# Patient Record
Sex: Female | Born: 1955 | Race: Black or African American | Hispanic: No | Smoking: Current every day smoker
Health system: Southern US, Community
[De-identification: ages and names within clinical notes are randomized; demographics above are authoritative.]

## PROBLEM LIST (undated history)

## (undated) DIAGNOSIS — K5792 Diverticulitis of intestine, part unspecified, without perforation or abscess without bleeding: Secondary | ICD-10-CM

## (undated) DIAGNOSIS — K635 Polyp of colon: Secondary | ICD-10-CM

## (undated) DIAGNOSIS — E039 Hypothyroidism, unspecified: Secondary | ICD-10-CM

## (undated) DIAGNOSIS — E7800 Pure hypercholesterolemia, unspecified: Secondary | ICD-10-CM

## (undated) DIAGNOSIS — I10 Essential (primary) hypertension: Secondary | ICD-10-CM

## (undated) HISTORY — PX: COLPOSCOPY: SHX161

## (undated) HISTORY — PX: BREAST BIOPSY: SHX20

## (undated) HISTORY — PX: TUBAL LIGATION: SHX77

## (undated) HISTORY — DX: Diverticulitis of intestine, part unspecified, without perforation or abscess without bleeding: K57.92

## (undated) HISTORY — DX: Pure hypercholesterolemia, unspecified: E78.00

## (undated) HISTORY — PX: FOOT SURGERY: SHX648

## (undated) HISTORY — DX: Essential (primary) hypertension: I10

## (undated) HISTORY — DX: Polyp of colon: K63.5

## (undated) HISTORY — PX: CHOLECYSTECTOMY: SHX55

---

## 2010-04-22 ENCOUNTER — Encounter: Admission: RE | Admit: 2010-04-22 | Discharge: 2010-04-22 | Payer: Self-pay | Admitting: Specialist

## 2017-12-28 ENCOUNTER — Other Ambulatory Visit: Payer: Self-pay | Admitting: Obstetrics and Gynecology

## 2017-12-28 DIAGNOSIS — R928 Other abnormal and inconclusive findings on diagnostic imaging of breast: Secondary | ICD-10-CM

## 2018-01-04 ENCOUNTER — Other Ambulatory Visit: Payer: Self-pay | Admitting: Obstetrics and Gynecology

## 2018-01-04 ENCOUNTER — Encounter: Payer: Self-pay | Admitting: Radiology

## 2018-01-04 ENCOUNTER — Ambulatory Visit
Admission: RE | Admit: 2018-01-04 | Discharge: 2018-01-04 | Disposition: A | Discharge status: Home / Self Care | Payer: BLUE CROSS/BLUE SHIELD | Source: Ambulatory Visit | Attending: Obstetrics and Gynecology | Admitting: Obstetrics and Gynecology

## 2018-01-04 DIAGNOSIS — R921 Mammographic calcification found on diagnostic imaging of breast: Secondary | ICD-10-CM

## 2018-01-04 DIAGNOSIS — R928 Other abnormal and inconclusive findings on diagnostic imaging of breast: Secondary | ICD-10-CM

## 2018-01-08 ENCOUNTER — Ambulatory Visit
Admission: RE | Admit: 2018-01-08 | Discharge: 2018-01-08 | Disposition: A | Discharge status: Home / Self Care | Payer: BLUE CROSS/BLUE SHIELD | Source: Ambulatory Visit | Attending: Obstetrics and Gynecology | Admitting: Obstetrics and Gynecology

## 2018-01-08 ENCOUNTER — Other Ambulatory Visit: Payer: Self-pay | Admitting: Obstetrics and Gynecology

## 2018-01-08 DIAGNOSIS — R921 Mammographic calcification found on diagnostic imaging of breast: Secondary | ICD-10-CM

## 2018-05-28 ENCOUNTER — Other Ambulatory Visit: Payer: Self-pay | Admitting: Obstetrics and Gynecology

## 2018-05-28 DIAGNOSIS — Z1231 Encounter for screening mammogram for malignant neoplasm of breast: Secondary | ICD-10-CM

## 2018-07-09 ENCOUNTER — Other Ambulatory Visit: Payer: Self-pay | Admitting: Obstetrics and Gynecology

## 2018-07-09 ENCOUNTER — Ambulatory Visit
Admission: RE | Admit: 2018-07-09 | Discharge: 2018-07-09 | Disposition: A | Discharge status: Home / Self Care | Payer: BLUE CROSS/BLUE SHIELD | Source: Ambulatory Visit | Attending: Obstetrics and Gynecology | Admitting: Obstetrics and Gynecology

## 2018-07-09 DIAGNOSIS — R921 Mammographic calcification found on diagnostic imaging of breast: Secondary | ICD-10-CM

## 2018-07-09 DIAGNOSIS — Z1231 Encounter for screening mammogram for malignant neoplasm of breast: Secondary | ICD-10-CM

## 2018-08-11 ENCOUNTER — Ambulatory Visit
Admission: RE | Admit: 2018-08-11 | Discharge: 2018-08-11 | Disposition: A | Discharge status: Home / Self Care | Payer: BLUE CROSS/BLUE SHIELD | Source: Ambulatory Visit | Attending: Obstetrics and Gynecology | Admitting: Obstetrics and Gynecology

## 2018-08-11 ENCOUNTER — Other Ambulatory Visit: Payer: Self-pay | Admitting: Obstetrics and Gynecology

## 2018-08-11 DIAGNOSIS — R921 Mammographic calcification found on diagnostic imaging of breast: Secondary | ICD-10-CM

## 2019-02-09 ENCOUNTER — Other Ambulatory Visit: Payer: Self-pay | Admitting: Registered Nurse

## 2019-02-09 DIAGNOSIS — R921 Mammographic calcification found on diagnostic imaging of breast: Secondary | ICD-10-CM

## 2019-02-10 ENCOUNTER — Other Ambulatory Visit: Payer: Self-pay | Admitting: Registered Nurse

## 2019-02-10 ENCOUNTER — Other Ambulatory Visit: Payer: Self-pay

## 2019-02-10 ENCOUNTER — Ambulatory Visit
Admission: RE | Admit: 2019-02-10 | Discharge: 2019-02-10 | Disposition: A | Discharge status: Home / Self Care | Payer: BC Managed Care – PPO | Source: Ambulatory Visit | Attending: Obstetrics and Gynecology | Admitting: Obstetrics and Gynecology

## 2019-02-10 DIAGNOSIS — R921 Mammographic calcification found on diagnostic imaging of breast: Secondary | ICD-10-CM

## 2019-08-12 ENCOUNTER — Other Ambulatory Visit: Payer: Self-pay

## 2019-08-15 ENCOUNTER — Other Ambulatory Visit: Payer: Self-pay

## 2019-08-15 ENCOUNTER — Ambulatory Visit: Payer: BC Managed Care – PPO | Admitting: Obstetrics and Gynecology

## 2019-08-15 ENCOUNTER — Ambulatory Visit
Admission: RE | Admit: 2019-08-15 | Discharge: 2019-08-15 | Disposition: A | Discharge status: Home / Self Care | Payer: BC Managed Care – PPO | Source: Ambulatory Visit | Attending: Registered Nurse | Admitting: Registered Nurse

## 2019-08-15 DIAGNOSIS — R921 Mammographic calcification found on diagnostic imaging of breast: Secondary | ICD-10-CM

## 2019-08-16 ENCOUNTER — Encounter: Payer: Self-pay | Admitting: Gynecology

## 2019-08-16 NOTE — Progress Notes (Unsigned)
Patient not seen by provider.  Records were not obtained prior to the visit today so she ultimately rescheduled to another day.  No charge for this encounter.

## 2019-08-17 ENCOUNTER — Encounter: Payer: Self-pay | Admitting: Obstetrics and Gynecology

## 2019-08-17 ENCOUNTER — Other Ambulatory Visit: Payer: Self-pay

## 2019-08-17 ENCOUNTER — Ambulatory Visit: Payer: BC Managed Care – PPO | Admitting: Obstetrics and Gynecology

## 2019-08-17 DIAGNOSIS — N8502 Endometrial intraepithelial neoplasia [EIN]: Secondary | ICD-10-CM | POA: Insufficient documentation

## 2019-08-17 DIAGNOSIS — I10 Essential (primary) hypertension: Secondary | ICD-10-CM | POA: Insufficient documentation

## 2019-08-17 DIAGNOSIS — E039 Hypothyroidism, unspecified: Secondary | ICD-10-CM | POA: Insufficient documentation

## 2019-08-17 NOTE — Progress Notes (Signed)
Destiny Larsen  Aug 27, 1955 [id number]  HPI The patient is a 64 y.o.  G2P1011 who presents today for further evaluation and management of postmenopausal bleeding and endometrial hyperplasia.  She was seen by her OB/GYN provider in [place], [place], and was having postmenopausal bleeding.  She described two episodes back in November 2020 starting with pinkish discharge followed by significant amount of bright red bleeding after a bowel movement.  She has not had any bleeding since then and her provider gave her Provera which she has been taking.  We do have her records faxed from her evaluation.  She had a pelvic ultrasound on 07/26/2019 which indicated a uterus 4.9 x 3.9 x 5.1 cm and a structure within the cervix that appeared to be a polyp measuring 1.8 cm.  Her endometrium was noted to be thickened measuring 1.3 cm in maximum dimension.  Her bilateral ovaries were noted to be normal.  She had a Pap smear performed 07/07/2019 which was NIL.  HPV test was negative.  She had an endometrial biopsy which indicated "atypical glandular hyperplasia" on 07/27/2019.  There is mention that she had a polypectomy which was not completely removed but the portion that was evaluated was benign per the provider note.  We do not have the pathology report for that particular specimen.   She states her provider told her that hysterectomy would be the recommended treatment and she wanted to seek another opinion.  She had a prior tubal ligation.  Prior SVD x 1.  She is currently semiretired but has started working as a Scientist, water quality at [employer], sometimes Corporate investment banker duties.  She spent 30+ years working for the state of [place].   Past medical history,surgical history, problem list, medications, allergies, family history and social history were all reviewed and documented as reviewed in the EPIC chart. Family history is notable for breast cancer in her mother and a "uterine cancer" on the "outside of the uterus" in her  sister.  No known BRCA testing.  ROS:  Feeling well. No dyspnea or chest pain on exertion.  No abdominal pain, change in bowel habits, black or bloody stools.  No urinary tract symptoms. GYN ROS: no active abnormal bleeding, pelvic pain or discharge. No neurological complaints.  Physical Exam  Ht '5\' 5"'  (1.651 m)   Wt 191 lb (86.6 kg)   BMI 31.78 kg/m   General: Pleasant female, no acute distress, alert and oriented PELVIC EXAM: Deferred until next visit.  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  I discussed the various types of hyperplasia with the patient and the fact that the finding of complex hyperplasia with atypia on endometrial sampling has upwards of a nearly 40% rate of coexisting with an endometrial cancer.  The standard of care for treatment of this condition is hysterectomy in patients who are healthy enough to tolerate surgery.  She would prefer to have surgery here in [place].  Based off of her surgical history, our tentative plan would be to perform a total laparoscopic hysterectomy with bilateral salpingo-oophorectomy.  We will have her scheduled for a preoperative visit and perform a pelvic exam at that time.  I provided her with the ACOG pamphlet on hysterectomy and we will discuss her questions in further detail at our next meeting, including risks of the surgery.  We did briefly touch on postoperative recovery expectations and need for at least 6 weeks  off from employment duties.  I did caution her that given the current pandemic there is potential for limitation on performing nonemergent procedures at this time, so we may have to wait for a certain amount of time before being able to have this procedure done.  Patient acknowledges understanding and will plan to get in touch with her regarding the next steps.  All questions were  answered by the end of her visit.  40 minutes were spent in this patient encounter including time reviewing previous tests and documentation.  Larey Days MD 08/17/19

## 2019-08-17 NOTE — Patient Instructions (Signed)
It was nice to meet you today, thank you for coming to [facility]. I will reach out to my staff and they should be in contact with you regarding the next steps in scheduling a follow-up visit with me and the surgery. Please let me know if any questions or do not hear anything further within the next week or two.

## 2019-08-19 ENCOUNTER — Telehealth: Payer: Self-pay

## 2019-08-19 NOTE — Telephone Encounter (Signed)
Left message for patient to call me about scheduling.

## 2019-08-30 NOTE — Telephone Encounter (Signed)
Patient called back and we discussed that I had spoken with her ins company and was advised she has $40 copayment for surgery and then pays 100%.  I told her that twice I clarified with rep that we were talking about surgery at hospital and not in the office and she said that was correct.  I warned patient that this did not sound reasonable but perhaps it was but to be prepared that she might end up owing a bill to [facility].  She said she understood.  We scheduled her for 09/20/19 at 7:30am at [facility].  I scheduled her Covid screen accordingly and advised her regarding quarantine protocol.    Pre op appt was scheduled for her and packet was mailed to her.

## 2019-09-07 ENCOUNTER — Encounter: Payer: Self-pay | Admitting: Gynecology

## 2019-09-09 ENCOUNTER — Other Ambulatory Visit: Payer: Self-pay

## 2019-09-12 ENCOUNTER — Ambulatory Visit: Payer: BC Managed Care – PPO | Admitting: Obstetrics and Gynecology

## 2019-09-12 ENCOUNTER — Other Ambulatory Visit: Payer: Self-pay

## 2019-09-12 ENCOUNTER — Encounter: Payer: Self-pay | Admitting: Obstetrics and Gynecology

## 2019-09-12 VITALS — BP 122/78

## 2019-09-12 DIAGNOSIS — N8502 Endometrial intraepithelial neoplasia [EIN]: Secondary | ICD-10-CM | POA: Diagnosis not present

## 2019-09-12 NOTE — Progress Notes (Signed)
   Destiny Larsen  1956-05-10 [id number]  HPI The patient is a 64 y.o. G2P0011 who presents today for preoperative visit preparation for a hysterectomy on 09/20/2019 with the indication of complex endometrial hyperplasia with atypia/EIN.  See the previous note from 08/17/2019 for details.  She has had light ongoing vaginal bleeding.  She did have a chance to review the ACOG  hysterectomy informational pamphlet.  Past medical history,surgical history, problem list, medications, allergies, family history and social history were all reviewed and documented as reviewed in the EPIC chart.  ROS:  Feeling well. No dyspnea or chest pain on exertion.  No abdominal pain, change in bowel habits, black or bloody stools.  No urinary tract symptoms. GYN ROS: + abnormal bleeding, pelvic pain or discharge.  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  We will perform TLH with bilateral salpingo-oophorectomy and cystoscopy.  I discussed the hysterectomy procedure in detail with the patient including the length of time anticipated to perform the procedure, the expectation for overnight stay in the hospital and subsequent discharge the following day in the absence of complications, and postoperative convalescence and recovery expectations.  I discussed that she will need to plan to be off of work for potentially up to 6 weeks but may be able go  back sooner a limited capacity keeping in mind lifting restrictions of 20 lbs and no repetitive squatting, bending, or straining maneuvers.  Pelvic rest restriction was also reviewed.  The patient is aware that hysterectomy is considered to be a major surgical procedure. There are risks of infection, bleeding, possibility of needing a blood transfusion, injury to bowel, bladder, major pelvic vessels, ureter, nerves from positioning and or skin irritation, and deep venous thrombosis.  General anesthesia also has risks including myocardial infarction, stroke, and death.  Common scenarios for complications from surgery were reviewed with the patient including focus on bladder and/or ureteral injuries, bowel injuries and complications such as bowel obstruction and rarely the need for temporary or permanent ostomies, and infections that may be minor or more involved and require readmission for IV antibiotics and/or repeat surgery.  Generally the complication rate is less than 1%.  Conversion to laparotomy may be deemed necessary at the time of the procedure, which if performed would result in longer hospital stay, and more postoperative pain and healing time.     The patient has all questions answered and agrees to proceeding.  Joseph Pierini MD 09/12/19

## 2019-09-12 NOTE — Patient Instructions (Signed)
Total Laparoscopic Hysterectomy °A total laparoscopic hysterectomy is a minimally invasive surgery to remove the uterus and cervix. The fallopian tubes and ovaries can also be removed (bilateral salpingo-oophorectomy) during this surgery, if necessary. This procedure may be done to treat problems such as: °· Noncancerous growths in the uterus (uterine fibroids) that cause symptoms. °· A condition that causes the lining of the uterus (endometrium) to grow in other areas (endometriosis). °· Problems with pelvic support. This is caused by weakened muscles of the pelvis following vaginal childbirth or menopause. °· Cancer of the cervix, ovaries, uterus, or endometrium. °· Excessive (dysfunctional) uterine bleeding. °This surgery is performed by inserting a thin, lighted tube (laparoscope) and surgical instruments into small incisions in the abdomen. The laparoscope sends images to a monitor. The images help the health care provider perform the procedure. After this procedure, you will no longer be able to have a baby, and you will no longer have a menstrual period. °Tell a health care provider about: °· Any allergies you have. °· All medicines you are taking, including vitamins, herbs, eye drops, creams, and over-the-counter medicines. °· Any problems you or family members have had with anesthetic medicines. °· Any blood disorders you have. °· Any surgeries you have had. °· Any medical conditions you have. °· Whether you are pregnant or may be pregnant. °What are the risks? °Generally, this is a safe procedure. However, problems may occur, including: °· Infection. °· Bleeding. °· Blood clots in the legs or lungs. °· Allergic reactions to medicines. °· Damage to other structures or organs. °· The risk that the surgery may have to be switched to the regular one in which a large incision is made in the abdomen (abdominal hysterectomy). °What happens before the procedure? °Staying hydrated °Follow instructions from your  health care provider about hydration, which may include: °· Up to 2 hours before the procedure - you may continue to drink clear liquids, such as water, clear fruit juice, black coffee, and plain tea °Eating and drinking restrictions °Follow instructions from your health care provider about eating and drinking, which may include: °· 8 hours before the procedure - stop eating heavy meals or foods such as meat, fried foods, or fatty foods. °· 6 hours before the procedure - stop eating light meals or foods, such as toast or cereal. °· 6 hours before the procedure - stop drinking milk or drinks that contain milk. °· 2 hours before the procedure - stop drinking clear liquids. °Medicines °· Ask your health care provider about: °? Changing or stopping your regular medicines. This is especially important if you are taking diabetes medicines or blood thinners. °? Taking over-the-counter medicines, vitamins, herbs, and supplements. °? Taking medicines such as aspirin and ibuprofen. These medicines can thin your blood. Do not take these medicines unless your health care provider tells you to take them. °· You may be given antibiotic medicine to help prevent infection. °· You may be asked to take laxatives. °· You may be given medicines to help prevent nausea and vomiting after the procedure. °General instructions °· Ask your health care provider how your surgical site will be marked or identified. °· You may be asked to shower with a germ-killing soap. °· Do not use any products that contain nicotine or tobacco, such as cigarettes and e-cigarettes. If you need help quitting, ask your health care provider. °· You may have an exam or testing, such as an ultrasound to determine the size and shape of your pelvic organs. °·   You may have a blood or urine sample taken. °· This procedure can affect the way you feel about yourself. Talk with your health care provider about the physical and emotional changes hysterectomy may  cause. °· Plan to have someone take you home from the hospital or clinic. °· Plan to have a responsible adult care for you for at least 24 hours after you leave the hospital or clinic. This is important. °What happens during the procedure? °· To lower your risk of infection: °? Your health care team will wash or sanitize their hands. °? Your skin will be washed with soap. °? Hair may be removed from the surgical area. °· An IV will be inserted into one of your veins. °· You will be given one or more of the following: °? A medicine to help you relax (sedative). °? A medicine to make you fall asleep (general anesthetic). °· You will be given antibiotic medicine through your IV. °· A tube may be inserted down your throat to help you breathe during the procedure. °· A gas (carbon dioxide) will be used to inflate your abdomen to allow your surgeon to see inside of your abdomen. °· Three or four small incisions will be made in your abdomen. °· A laparoscope will be inserted into one of your incisions. Surgical instruments will be inserted through the other incisions in order to perform the procedure. °· Your uterus and cervix may be removed through your vagina or cut into small pieces and removed through the small incisions. Any other organs that need to be removed will also be removed this way. °· Carbon dioxide will be released from inside of your abdomen. °· Your incisions will be closed with stitches (sutures). °· A bandage (dressing) may be placed over your incisions. °The procedure may vary among health care providers and hospitals. °What happens after the procedure? °· Your blood pressure, heart rate, breathing rate, and blood oxygen level will be monitored until the medicines you were given have worn off. °· You will be given medicine for pain and nausea as needed. °· Do not drive for 24 hours if you received a sedative. °Summary °· Total Laparoscopic hysterectomy is a procedure to remove your uterus, cervix and  sometimes the fallopian tubes and ovaries. °· This procedure can affect the way you feel about yourself. Talk with your health care provider about the physical and emotional changes hysterectomy may cause. °· After this procedure, you will no longer be able to have a baby, and you will no longer have a menstrual period. °· You will be given pain medicine to control discomfort after this procedure. °This information is not intended to replace advice given to you by your health care provider. Make sure you discuss any questions you have with your health care provider. °Document Revised: 06/19/2017 Document Reviewed: 09/17/2016 °Elsevier Patient Education © 2020 [employer] °Total Laparoscopic Hysterectomy, Care After °This sheet gives you information about how to care for yourself after your procedure. Your health care provider may also give you more specific instructions. If you have problems or questions, contact your health care provider. °What can I expect after the procedure? °After the procedure, it is common to have: °· Pain and bruising around your incisions. °· A sore throat, if a breathing tube was used during surgery. °· Fatigue. °· Poor appetite. °· Less interest in sex. °If your ovaries were also removed, it is also common to have symptoms of menopause such as hot flashes, night sweats,   and lack of sleep (insomnia). °Follow these instructions at home: °Bathing °· Do not take baths, swim, or use a hot tub until your health care provider approves. You may need to only take showers for 2-3 weeks. °· Keep your bandage (dressing) dry until your health care provider says it can be removed. °Incision care ° °· Follow instructions from your health care provider about how to take care of your incisions. Make sure you: °? Wash your hands with soap and water before you change your dressing. If soap and water are not available, use hand sanitizer. °? Change your dressing as told by your health care  provider. °? Leave stitches (sutures), skin glue, or adhesive strips in place. These skin closures may need to stay in place for 2 weeks or longer. If adhesive strip edges start to loosen and curl up, you may trim the loose edges. Do not remove adhesive strips completely unless your health care provider tells you to do that. °· Check your incision area every day for signs of infection. Check for: °? Redness, swelling, or pain. °? Fluid or blood. °? Warmth. °? Pus or a bad smell. °Activity °· Get plenty of rest and sleep. °· Do not lift anything that is heavier than 10 lbs (4.5 kg) for one month after surgery, or as long as told by your health care provider. °· Do not drive or use heavy machinery while taking prescription pain medicine. °· Do not drive for 24 hours if you were given a medicine to help you relax (sedative). °· Return to your normal activities as told by your health care provider. Ask your health care provider what activities are safe for you. °Lifestyle ° °· Do not use any products that contain nicotine or tobacco, such as cigarettes and e-cigarettes. These can delay healing. If you need help quitting, ask your health care provider. °· Do not drink alcohol until your health care provider approves. °General instructions °· Do not douche, use tampons, or have sex for at least 6 weeks, or as told by your health care provider. °· Take over-the-counter and prescription medicines only as told by your health care provider. °· To monitor yourself for a fever, take your temperature at least once a day during recovery. °· If you struggle with physical or emotional changes after your procedure, speak with your health care provider or a therapist. °· To prevent or treat constipation while you are taking prescription pain medicine, your health care provider may recommend that you: °? Drink enough fluid to keep your urine clear or pale yellow. °? Take over-the-counter or prescription medicines. °? Eat foods that  are high in fiber, such as fresh fruits and vegetables, whole grains, and beans. °? Limit foods that are high in fat and processed sugars, such as fried and sweet foods. °· Keep all follow-up visits as told by your health care provider. This is important. °Contact a health care provider if: °· You have chills or a fever. °· You have redness, swelling, or pain around an incision. °· You have fluid or blood coming from an incision. °· Your incision feels warm to the touch. °· You have pus or a bad smell coming from an incision. °· An incision breaks open. °· You feel dizzy or light-headed. °· You have pain or bleeding when you urinate. °· You have diarrhea, nausea, or vomiting that does not go away. °· You have abnormal vaginal discharge. °· You have a rash. °· You have pain that does   not get better with medicine. °Get help right away if: °· You have a fever and your symptoms suddenly get worse. °· You have severe abdominal pain. °· You have chest pain. °· You have shortness of breath. °· You faint. °· You have pain, swelling, or redness on your leg. °· You have heavy vaginal bleeding with blood clots. °Summary °· After the procedure it is common to have abdominal pain. Your provider will give you medication for this. °· Do not take baths, swim, or use a hot tub until your health care provider approves. °· Do not lift anything that is heavier than 10 lbs (4.5 kg) for one month after surgery, or as long as told by your health care provider. °· Notify your provider if you have any signs or symptoms of infection after the procedure. °This information is not intended to replace advice given to you by your health care provider. Make sure you discuss any questions you have with your health care provider. °Document Revised: 06/19/2017 Document Reviewed: 09/17/2016 °Elsevier Patient Education © 2020 [employer] ° °

## 2019-09-12 NOTE — Patient Instructions (Addendum)
YOU ARE SCHEDULED FOR A COVID TEST __Friday 02/26/2021_______@__3 :10 PM__________. THIS TEST MUST BE DONE BEFORE SURGERY. GO TO  [address], , [address] AND REMAIN IN YOUR CAR, THIS IS A DRIVE UP TEST. ONCE YOUR COVID TEST IS DONE PLEASE FOLLOW ALL THE QUARANTINE  INSTRUCTIONS GIVEN IN YOUR HANDOUT.      Your procedure is scheduled on  Tuesday  09/20/2019    Report to [facility]. M.   Call this number if you have problems the morning of surgery  :[phone].   OUR ADDRESS IS [address].  WE ARE LOCATED IN [facility].                                     REMEMBER:  DO NOT EAT FOOD OR DRINK LIQUIDS AFTER MIDNIGHT .    YOU MAY  BRUSH YOUR TEETH MORNING OF SURGERY AND RINSE YOUR MOUTH OUT, NO CHEWING GUM CANDY OR MINTS.    TAKE THESE MEDICATIONS MORNING OF SURGERY WITH A SIP OF WATER:  Levothyroxine (Synthroid), Metoprolol tartrate (Lopressor), Atorvastatin (Lipitor)   IF YOU ARE SPENDING THE NIGHT AFTER SURGERY PLEASE BRING ALL YOUR PRESCRIPTION MEDICATIONS IN THEIR ORIGINAL BOTTLES.   1 VISITOR IS ALLOWED IN WAITING ROOM ONLY DAY OF SURGERY.   NO VISITOR MAY SPEND THE NIGHT. VISITOR ARE ALLOWED TO STAY UNTIL 800 PM.                                    DO NOT WEAR JEWERLY, MAKE UP, OR NAIL POLISH ON FINGERNAILS. DO NOT WEAR LOTIONS, POWDERS, PERFUMES OR DEODORANT. DO NOT SHAVE FOR 24 HOURS PRIOR TO DAY OF SURGERY.  CONTACTS, GLASSES, OR DENTURES MAY NOT BE WORN TO SURGERY.                                    [facility] IS NOT RESPONSIBLE  FOR ANY BELONGINGS.                                                                    Marland Kitchen                                                                                                    [facility] - Preparing for Surgery Before surgery, you can play an important role.  Because skin is not sterile, your skin needs to be as free of germs as possible.  You can reduce  the number of germs on your skin by washing with CHG (chlorahexidine gluconate) soap before  surgery.  CHG is an antiseptic cleaner which kills germs and bonds with the skin to continue killing germs even after washing. Please DO NOT use if you have an allergy to CHG or antibacterial soaps.  If your skin becomes reddened/irritated stop using the CHG and inform your nurse when you arrive at Short Stay. Do not shave (including legs and underarms) for at least 48 hours prior to the first CHG shower.  You may shave your face/neck. Please follow these instructions carefully:  1.  Shower with CHG Soap the night before surgery and the  morning of Surgery.  2.  If you choose to wash your hair, wash your hair first as usual with your  normal  shampoo.  3.  After you shampoo, rinse your hair and body thoroughly to remove the  shampoo.                           4.  Use CHG as you would any other liquid soap.  You can apply chg directly  to the skin and wash                       Gently with a scrungie or clean washcloth.  5.  Apply the CHG Soap to your body ONLY FROM THE NECK DOWN.   Do not use on face/ open                           Wound or open sores. Avoid contact with eyes, ears mouth and genitals (private parts).                       Wash face,  Genitals (private parts) with your normal soap.             6.  Wash thoroughly, paying special attention to the area where your surgery  will be performed.  7.  Thoroughly rinse your body with warm water from the neck down.  8.  DO NOT shower/wash with your normal soap after using and rinsing off  the CHG Soap.                9.  Pat yourself dry with a clean towel.            10.  Wear clean pajamas.            11.  Place clean sheets on your bed the night of your first shower and do not  sleep with pets. Day of Surgery : Do not apply any lotions/deodorants the morning of surgery.  Please wear clean clothes to the hospital/surgery center.  FAILURE TO FOLLOW  THESE INSTRUCTIONS MAY RESULT IN THE CANCELLATION OF YOUR SURGERY PATIENT SIGNATURE_________________________________  NURSE SIGNATURE__________________________________  ________________________________________________________________________

## 2019-09-13 ENCOUNTER — Encounter (HOSPITAL_COMMUNITY): Payer: Self-pay

## 2019-09-13 ENCOUNTER — Other Ambulatory Visit: Payer: Self-pay

## 2019-09-13 ENCOUNTER — Encounter (HOSPITAL_COMMUNITY)
Admission: RE | Admit: 2019-09-13 | Discharge: 2019-09-13 | Disposition: A | Discharge status: Home / Self Care | Payer: BC Managed Care – PPO | Source: Ambulatory Visit | Attending: Obstetrics and Gynecology | Admitting: Obstetrics and Gynecology

## 2019-09-13 HISTORY — DX: Hypothyroidism, unspecified: E03.9

## 2019-09-13 NOTE — Pre-Procedure Instructions (Signed)
PCP - Dr. Margaretha Sheffield  [place], [place]. Cardiologist - n/a  Chest x-ray - n/a EKG - n/a Stress Test -n/a  ECHO - n/a Cardiac Cath - n/a  Sleep Study -n/a  CPAP - n/a  Fasting Blood Sugar - n/a Checks Blood Sugar _0____ times a day  Blood Thinner Instructions:n/a Aspirin Instructions:Aspirin 81 mg. Instructed patient to call Dr. Danelle Berry office and get instructions from him as to when she needs to stop the Aspirin for surgery. Last Dose:09/13/2019  Anesthesia review:   Patient has a history of hypothyroidism and HTN.  Patient denies shortness of breath, fever, cough and chest pain at PAT appointment   Patient verbalized understanding of instructions that were given to them at the PAT appointment. Patient was also instructed that they will need to review over the PAT instructions again at home before surgery.

## 2019-09-16 ENCOUNTER — Other Ambulatory Visit: Payer: Self-pay

## 2019-09-16 ENCOUNTER — Other Ambulatory Visit (HOSPITAL_COMMUNITY)
Admission: RE | Admit: 2019-09-16 | Discharge: 2019-09-16 | Disposition: A | Discharge status: Home / Self Care | Payer: BC Managed Care – PPO | Source: Ambulatory Visit | Attending: Obstetrics and Gynecology | Admitting: Obstetrics and Gynecology

## 2019-09-16 ENCOUNTER — Encounter (HOSPITAL_COMMUNITY)
Admission: RE | Admit: 2019-09-16 | Discharge: 2019-09-16 | Disposition: A | Discharge status: Home / Self Care | Payer: BC Managed Care – PPO | Source: Ambulatory Visit | Attending: Obstetrics and Gynecology | Admitting: Obstetrics and Gynecology

## 2019-09-16 DIAGNOSIS — Z20822 Contact with and (suspected) exposure to covid-19: Secondary | ICD-10-CM | POA: Insufficient documentation

## 2019-09-16 DIAGNOSIS — Z01810 Encounter for preprocedural cardiovascular examination: Secondary | ICD-10-CM | POA: Insufficient documentation

## 2019-09-16 DIAGNOSIS — I10 Essential (primary) hypertension: Secondary | ICD-10-CM | POA: Diagnosis not present

## 2019-09-16 DIAGNOSIS — Z01812 Encounter for preprocedural laboratory examination: Secondary | ICD-10-CM | POA: Diagnosis not present

## 2019-09-16 LAB — BASIC METABOLIC PANEL
Anion gap: 11 (ref 5–15)
BUN: 9 mg/dL (ref 8–23)
CO2: 25 mmol/L (ref 22–32)
Calcium: 9.8 mg/dL (ref 8.9–10.3)
Chloride: 105 mmol/L (ref 98–111)
Creatinine, Ser: 0.93 mg/dL (ref 0.44–1.00)
GFR calc Af Amer: >60 mL/min (ref >60)
GFR calc non Af Amer: >60 mL/min (ref >60)
Glucose, Bld: 124 mg/dL — ABNORMAL HIGH (ref 70–99)
Potassium: 4 mmol/L (ref 3.5–5.1)
Sodium: 141 mmol/L (ref 135–145)

## 2019-09-16 LAB — CBC
HCT: 42.7 % (ref 36.0–46.0)
Hemoglobin: 14.3 g/dL (ref 12.0–15.0)
MCH: 32.7 pg (ref 26.0–34.0)
MCHC: 33.5 g/dL (ref 30.0–36.0)
MCV: 97.7 fL (ref 80.0–100.0)
Platelets: 221 10*3/uL (ref 150–400)
RBC: 4.37 MIL/uL (ref 3.87–5.11)
RDW: 12.1 % (ref 11.5–15.5)
WBC: 5.3 10*3/uL (ref 4.0–10.5)
nRBC: 0 % (ref 0.0–0.2)

## 2019-09-16 LAB — SARS CORONAVIRUS 2 (TAT 6-24 HRS): SARS Coronavirus 2: NEGATIVE

## 2019-09-17 LAB — ABO/RH: ABO/RH(D): B POS

## 2019-09-20 ENCOUNTER — Encounter (HOSPITAL_BASED_OUTPATIENT_CLINIC_OR_DEPARTMENT_OTHER): Payer: Self-pay | Admitting: Obstetrics and Gynecology

## 2019-09-20 ENCOUNTER — Encounter (HOSPITAL_BASED_OUTPATIENT_CLINIC_OR_DEPARTMENT_OTHER)
Admission: RE | Disposition: A | Discharge status: Home / Self Care | Payer: Self-pay | Source: Home / Self Care | Attending: Obstetrics and Gynecology

## 2019-09-20 ENCOUNTER — Observation Stay (HOSPITAL_BASED_OUTPATIENT_CLINIC_OR_DEPARTMENT_OTHER)
Admission: RE | Admit: 2019-09-20 | Discharge: 2019-09-21 | Disposition: A | Discharge status: Home / Self Care | Payer: BC Managed Care – PPO | Attending: Obstetrics and Gynecology | Admitting: Obstetrics and Gynecology

## 2019-09-20 ENCOUNTER — Observation Stay (HOSPITAL_BASED_OUTPATIENT_CLINIC_OR_DEPARTMENT_OTHER): Payer: BC Managed Care – PPO | Admitting: Anesthesiology

## 2019-09-20 ENCOUNTER — Observation Stay (HOSPITAL_BASED_OUTPATIENT_CLINIC_OR_DEPARTMENT_OTHER): Payer: BC Managed Care – PPO | Admitting: Physician Assistant

## 2019-09-20 DIAGNOSIS — N840 Polyp of corpus uteri: Secondary | ICD-10-CM

## 2019-09-20 DIAGNOSIS — F17210 Nicotine dependence, cigarettes, uncomplicated: Secondary | ICD-10-CM | POA: Insufficient documentation

## 2019-09-20 DIAGNOSIS — D259 Leiomyoma of uterus, unspecified: Secondary | ICD-10-CM

## 2019-09-20 DIAGNOSIS — E039 Hypothyroidism, unspecified: Secondary | ICD-10-CM | POA: Diagnosis not present

## 2019-09-20 DIAGNOSIS — Z79890 Hormone replacement therapy (postmenopausal): Secondary | ICD-10-CM | POA: Diagnosis not present

## 2019-09-20 DIAGNOSIS — Z79899 Other long term (current) drug therapy: Secondary | ICD-10-CM | POA: Insufficient documentation

## 2019-09-20 DIAGNOSIS — N8502 Endometrial intraepithelial neoplasia [EIN]: Principal | ICD-10-CM | POA: Diagnosis present

## 2019-09-20 DIAGNOSIS — I10 Essential (primary) hypertension: Secondary | ICD-10-CM | POA: Diagnosis not present

## 2019-09-20 HISTORY — PX: LAPAROSCOPIC HYSTERECTOMY: SHX1926

## 2019-09-20 LAB — TYPE AND SCREEN
ABO/RH(D): B POS
Antibody Screen: NEGATIVE

## 2019-09-20 SURGERY — HYSTERECTOMY, TOTAL, LAPAROSCOPIC
Anesthesia: General | Site: Abdomen | Laterality: Bilateral

## 2019-09-20 MED ORDER — OXYCODONE HCL 5 MG PO TABS: 5.0000 mg | ORAL_TABLET | ORAL | 0 refills | Status: DC | PRN

## 2019-09-20 MED ORDER — DOCUSATE SODIUM 100 MG PO CAPS
100.0000 mg | ORAL_CAPSULE | Freq: Two times a day (BID) | ORAL | Status: DC
  Administered 2019-09-20 (×3): 100 mg via ORAL
  Filled 2019-09-20: qty 1

## 2019-09-20 MED ORDER — LIDOCAINE 2% (20 MG/ML) 5 ML SYRINGE
INTRAMUSCULAR | Status: AC
  Filled 2019-09-20: qty 5

## 2019-09-20 MED ORDER — ROCURONIUM BROMIDE 100 MG/10ML IV SOLN
INTRAVENOUS | Status: DC | PRN
  Administered 2019-09-20: 20 mg via INTRAVENOUS
  Administered 2019-09-20: 80 mg via INTRAVENOUS

## 2019-09-20 MED ORDER — LEVOTHYROXINE SODIUM 100 MCG PO TABS
100.0000 ug | ORAL_TABLET | Freq: Every day | ORAL | Status: DC
  Administered 2019-09-21: 06:00:00 100 ug via ORAL
  Filled 2019-09-20: qty 1

## 2019-09-20 MED ORDER — ONDANSETRON HCL 4 MG/2ML IJ SOLN
4.0000 mg | Freq: Four times a day (QID) | INTRAMUSCULAR | Status: DC | PRN
  Filled 2019-09-20: qty 2

## 2019-09-20 MED ORDER — OXYCODONE HCL 5 MG PO TABS
ORAL_TABLET | ORAL | Status: AC
  Filled 2019-09-20: qty 1

## 2019-09-20 MED ORDER — KETOROLAC TROMETHAMINE 30 MG/ML IJ SOLN
INTRAMUSCULAR | Status: AC
  Filled 2019-09-20: qty 1

## 2019-09-20 MED ORDER — ACETAMINOPHEN 500 MG PO TABS
1000.0000 mg | ORAL_TABLET | Freq: Four times a day (QID) | ORAL | Status: DC
  Administered 2019-09-20 – 2019-09-21 (×4): 1000 mg via ORAL
  Filled 2019-09-20: qty 2

## 2019-09-20 MED ORDER — PHENYLEPHRINE HCL (PRESSORS) 10 MG/ML IV SOLN
INTRAVENOUS | Status: DC | PRN
  Administered 2019-09-20 (×2): 80 ug via INTRAVENOUS

## 2019-09-20 MED ORDER — ONDANSETRON HCL 4 MG PO TABS
4.0000 mg | ORAL_TABLET | Freq: Four times a day (QID) | ORAL | Status: DC | PRN
  Filled 2019-09-20: qty 1

## 2019-09-20 MED ORDER — HYDROMORPHONE HCL 1 MG/ML IJ SOLN
0.2000 mg | INTRAMUSCULAR | Status: DC | PRN
  Filled 2019-09-20: qty 1

## 2019-09-20 MED ORDER — LISINOPRIL 40 MG PO TABS
40.0000 mg | ORAL_TABLET | Freq: Every day | ORAL | Status: DC
  Administered 2019-09-20: 13:00:00 40 mg via ORAL
  Filled 2019-09-20: qty 1

## 2019-09-20 MED ORDER — BUPIVACAINE HCL 0.25 % IJ SOLN
10.0000 mL | Freq: Once | INTRAMUSCULAR | Status: AC
  Administered 2019-09-20: 13 mL
  Filled 2019-09-20: qty 10

## 2019-09-20 MED ORDER — LACTATED RINGERS IV SOLN
INTRAVENOUS | Status: DC
  Filled 2019-09-20 (×2): qty 1000

## 2019-09-20 MED ORDER — CEFAZOLIN SODIUM-DEXTROSE 2-4 GM/100ML-% IV SOLN
INTRAVENOUS | Status: AC
  Filled 2019-09-20: qty 100

## 2019-09-20 MED ORDER — DEXAMETHASONE SODIUM PHOSPHATE 10 MG/ML IJ SOLN
INTRAMUSCULAR | Status: AC
  Filled 2019-09-20: qty 1

## 2019-09-20 MED ORDER — IBUPROFEN 600 MG PO TABS: 600.0000 mg | ORAL_TABLET | Freq: Four times a day (QID) | ORAL | 1 refills | Status: AC

## 2019-09-20 MED ORDER — SUGAMMADEX SODIUM 200 MG/2ML IV SOLN
INTRAVENOUS | Status: DC | PRN
  Administered 2019-09-20: 200 mg via INTRAVENOUS

## 2019-09-20 MED ORDER — KETOROLAC TROMETHAMINE 30 MG/ML IJ SOLN
30.0000 mg | Freq: Four times a day (QID) | INTRAMUSCULAR | Status: DC
  Administered 2019-09-20 – 2019-09-21 (×3): 30 mg via INTRAVENOUS
  Filled 2019-09-20: qty 1

## 2019-09-20 MED ORDER — PROPOFOL 10 MG/ML IV BOLUS
INTRAVENOUS | Status: AC
  Filled 2019-09-20: qty 40

## 2019-09-20 MED ORDER — ONDANSETRON HCL 4 MG/2ML IJ SOLN
INTRAMUSCULAR | Status: DC | PRN
  Administered 2019-09-20: 4 mg via INTRAVENOUS

## 2019-09-20 MED ORDER — LACTATED RINGERS IV BOLUS
1000.0000 mL | Freq: Once | INTRAVENOUS | Status: AC
  Administered 2019-09-20: 1000 mL via INTRAVENOUS
  Filled 2019-09-20: qty 1000

## 2019-09-20 MED ORDER — PHENAZOPYRIDINE HCL 100 MG PO TABS
100.0000 mg | ORAL_TABLET | ORAL | Status: AC
  Administered 2019-09-20: 100 mg via ORAL
  Filled 2019-09-20: qty 1

## 2019-09-20 MED ORDER — OXYCODONE HCL 5 MG PO TABS
5.0000 mg | ORAL_TABLET | ORAL | Status: DC | PRN
  Administered 2019-09-20: 12:00:00 5 mg via ORAL
  Filled 2019-09-20: qty 2

## 2019-09-20 MED ORDER — BISACODYL 5 MG PO TBEC
5.0000 mg | DELAYED_RELEASE_TABLET | Freq: Every day | ORAL | Status: DC | PRN
  Filled 2019-09-20: qty 1

## 2019-09-20 MED ORDER — SODIUM CHLORIDE 0.9 % IR SOLN
Status: DC | PRN
  Administered 2019-09-20: 3000 mL

## 2019-09-20 MED ORDER — ZOLPIDEM TARTRATE 5 MG PO TABS
5.0000 mg | ORAL_TABLET | Freq: Every evening | ORAL | Status: DC | PRN
  Filled 2019-09-20: qty 1

## 2019-09-20 MED ORDER — ACETAMINOPHEN 500 MG PO TABS
ORAL_TABLET | ORAL | Status: AC
  Filled 2019-09-20: qty 2

## 2019-09-20 MED ORDER — ONDANSETRON HCL 4 MG/2ML IJ SOLN
INTRAMUSCULAR | Status: AC
  Filled 2019-09-20: qty 2

## 2019-09-20 MED ORDER — FENTANYL CITRATE (PF) 100 MCG/2ML IJ SOLN
INTRAMUSCULAR | Status: AC
  Filled 2019-09-20: qty 2

## 2019-09-20 MED ORDER — SIMETHICONE 80 MG PO CHEW
80.0000 mg | CHEWABLE_TABLET | Freq: Four times a day (QID) | ORAL | Status: DC | PRN
  Filled 2019-09-20: qty 1

## 2019-09-20 MED ORDER — HYDROCHLOROTHIAZIDE 12.5 MG PO TABS
12.5000 mg | ORAL_TABLET | Freq: Every day | ORAL | Status: DC
  Administered 2019-09-20: 13:00:00 12.5 mg via ORAL
  Filled 2019-09-20: qty 1

## 2019-09-20 MED ORDER — POLYETHYLENE GLYCOL 3350 17 G PO PACK
17.0000 g | PACK | Freq: Every day | ORAL | Status: DC | PRN
  Filled 2019-09-20: qty 1

## 2019-09-20 MED ORDER — ROCURONIUM BROMIDE 10 MG/ML (PF) SYRINGE
PREFILLED_SYRINGE | INTRAVENOUS | Status: AC
  Filled 2019-09-20: qty 10

## 2019-09-20 MED ORDER — DOCUSATE SODIUM 100 MG PO CAPS: 100.0000 mg | ORAL_CAPSULE | Freq: Two times a day (BID) | ORAL | 1 refills | Status: AC

## 2019-09-20 MED ORDER — PROPOFOL 10 MG/ML IV BOLUS
INTRAVENOUS | Status: DC | PRN
  Administered 2019-09-20 (×2): 100 mg via INTRAVENOUS

## 2019-09-20 MED ORDER — CEFAZOLIN SODIUM-DEXTROSE 2-4 GM/100ML-% IV SOLN
2.0000 g | INTRAVENOUS | Status: AC
  Administered 2019-09-20: 2 g via INTRAVENOUS
  Filled 2019-09-20: qty 100

## 2019-09-20 MED ORDER — MENTHOL 3 MG MT LOZG
1.0000 | LOZENGE | OROMUCOSAL | Status: DC | PRN
  Filled 2019-09-20: qty 9

## 2019-09-20 MED ORDER — LACTATED RINGERS IV SOLN
INTRAVENOUS | Status: DC
  Filled 2019-09-20 (×3): qty 1000

## 2019-09-20 MED ORDER — DEXAMETHASONE SODIUM PHOSPHATE 4 MG/ML IJ SOLN
INTRAMUSCULAR | Status: DC | PRN
  Administered 2019-09-20: 10 mg via INTRAVENOUS

## 2019-09-20 MED ORDER — MIDAZOLAM HCL 5 MG/5ML IJ SOLN
INTRAMUSCULAR | Status: DC | PRN
  Administered 2019-09-20: 2 mg via INTRAVENOUS

## 2019-09-20 MED ORDER — ACETAMINOPHEN 500 MG PO TABS: 1000.0000 mg | ORAL_TABLET | Freq: Four times a day (QID) | ORAL | 1 refills | Status: AC

## 2019-09-20 MED ORDER — KETOROLAC TROMETHAMINE 30 MG/ML IJ SOLN
INTRAMUSCULAR | Status: DC | PRN
  Administered 2019-09-20: 30 mg via INTRAVENOUS

## 2019-09-20 MED ORDER — METOPROLOL TARTRATE 25 MG PO TABS
25.0000 mg | ORAL_TABLET | Freq: Every day | ORAL | Status: DC
  Filled 2019-09-20: qty 1

## 2019-09-20 MED ORDER — LIDOCAINE HCL (CARDIAC) PF 100 MG/5ML IV SOSY
PREFILLED_SYRINGE | INTRAVENOUS | Status: DC | PRN
  Administered 2019-09-20: 60 mg via INTRAVENOUS

## 2019-09-20 MED ORDER — FENTANYL CITRATE (PF) 100 MCG/2ML IJ SOLN
INTRAMUSCULAR | Status: DC | PRN
  Administered 2019-09-20 (×4): 50 ug via INTRAVENOUS

## 2019-09-20 MED ORDER — DOCUSATE SODIUM 100 MG PO CAPS
ORAL_CAPSULE | ORAL | Status: AC
  Filled 2019-09-20: qty 1

## 2019-09-20 MED ORDER — MIDAZOLAM HCL 2 MG/2ML IJ SOLN
INTRAMUSCULAR | Status: AC
  Filled 2019-09-20: qty 2

## 2019-09-20 MED ORDER — PHENYLEPHRINE 40 MCG/ML (10ML) SYRINGE FOR IV PUSH (FOR BLOOD PRESSURE SUPPORT)
PREFILLED_SYRINGE | INTRAVENOUS | Status: AC
  Filled 2019-09-20: qty 10

## 2019-09-20 MED ORDER — ACETAMINOPHEN 500 MG PO TABS
1000.0000 mg | ORAL_TABLET | Freq: Once | ORAL | Status: AC
  Administered 2019-09-20: 1000 mg via ORAL
  Filled 2019-09-20: qty 2

## 2019-09-20 MED ORDER — IBUPROFEN 800 MG PO TABS
800.0000 mg | ORAL_TABLET | Freq: Four times a day (QID) | ORAL | Status: DC
  Filled 2019-09-20: qty 1

## 2019-09-20 MED ORDER — FENTANYL CITRATE (PF) 100 MCG/2ML IJ SOLN
25.0000 ug | INTRAMUSCULAR | Status: DC | PRN
  Administered 2019-09-20 (×2): 25 ug via INTRAVENOUS
  Filled 2019-09-20: qty 1

## 2019-09-20 MED ORDER — PHENAZOPYRIDINE HCL 100 MG PO TABS
ORAL_TABLET | ORAL | Status: AC
  Filled 2019-09-20: qty 1

## 2019-09-20 SURGICAL SUPPLY — 62 items
BARRIER ADHS 3X4 INTERCEED (GAUZE/BANDAGES/DRESSINGS) IMPLANT
CABLE HIGH FREQUENCY MONO STRZ (ELECTRODE) ×2 IMPLANT
CANISTER SUCT 3000ML PPV (MISCELLANEOUS) ×2 IMPLANT
DECANTER SPIKE VIAL GLASS SM (MISCELLANEOUS) ×2 IMPLANT
DEFOGGER SCOPE WARMER CLEARIFY (MISCELLANEOUS) ×2 IMPLANT
DERMABOND ADVANCED (GAUZE/BANDAGES/DRESSINGS) ×1
DERMABOND ADVANCED .7 DNX12 (GAUZE/BANDAGES/DRESSINGS) ×1 IMPLANT
DEVICE SUTURE ENDOST 10MM (ENDOMECHANICALS) ×1 IMPLANT
DURAPREP 26ML APPLICATOR (WOUND CARE) ×2 IMPLANT
GLOVE BIO SURGEON STRL SZ 6.5 (GLOVE) ×1 IMPLANT
GLOVE BIO SURGEON STRL SZ7 (GLOVE) ×1 IMPLANT
GLOVE BIO SURGEON STRL SZ8 (GLOVE) ×4 IMPLANT
GLOVE BIOGEL PI IND STRL 7.0 (GLOVE) IMPLANT
GLOVE BIOGEL PI IND STRL 7.5 (GLOVE) IMPLANT
GLOVE BIOGEL PI IND STRL 8 (GLOVE) ×2 IMPLANT
GLOVE BIOGEL PI IND STRL 8.5 (GLOVE) IMPLANT
GLOVE BIOGEL PI INDICATOR 7.0 (GLOVE) ×3
GLOVE BIOGEL PI INDICATOR 7.5 (GLOVE) ×2
GLOVE BIOGEL PI INDICATOR 8 (GLOVE) ×2
GLOVE BIOGEL PI INDICATOR 8.5 (GLOVE) ×4
GLOVE INDICATOR 8.5 STRL (GLOVE) ×5 IMPLANT
GOWN STRL REUS W/ TWL LRG LVL3 (GOWN DISPOSABLE) ×1 IMPLANT
GOWN STRL REUS W/ TWL XL LVL3 (GOWN DISPOSABLE) ×1 IMPLANT
GOWN STRL REUS W/TWL LRG LVL3 (GOWN DISPOSABLE) ×4
GOWN STRL REUS W/TWL XL LVL3 (GOWN DISPOSABLE) ×6
HIBICLENS CHG 4% 4OZ (MISCELLANEOUS) ×3 IMPLANT
KIT TURNOVER CYSTO (KITS) ×2 IMPLANT
LIGASURE VESSEL 5MM BLUNT TIP (ELECTROSURGICAL) ×1 IMPLANT
MANIPULATOR VCARE LG CRV RETR (MISCELLANEOUS) IMPLANT
MANIPULATOR VCARE SML CRV RETR (MISCELLANEOUS) IMPLANT
MANIPULATOR VCARE STD CRV RETR (MISCELLANEOUS) ×1 IMPLANT
OCCLUDER COLPOPNEUMO (BALLOONS) ×2 IMPLANT
PACK LAPAROSCOPY BASIN (CUSTOM PROCEDURE TRAY) ×2 IMPLANT
PACK TRENDGUARD 450 HYBRID PRO (MISCELLANEOUS) IMPLANT
PAD ARMBOARD 7.5X6 YLW CONV (MISCELLANEOUS) ×4 IMPLANT
PAD OB MATERNITY 4.3X12.25 (PERSONAL CARE ITEMS) ×2 IMPLANT
POUCH LAPAROSCOPIC INSTRUMENT (MISCELLANEOUS) ×3 IMPLANT
SCISSORS LAP 5X35 DISP (ENDOMECHANICALS) ×2 IMPLANT
SET IRRIG TUBING LAPAROSCOPIC (IRRIGATION / IRRIGATOR) ×2 IMPLANT
SET IRRIG Y TYPE TUR BLADDER L (SET/KITS/TRAYS/PACK) ×1 IMPLANT
SET TRI-LUMEN FLTR TB AIRSEAL (TUBING) IMPLANT
SET TUBE SMOKE EVAC HIGH FLOW (TUBING) ×2 IMPLANT
SOLUTION ELECTROLUBE (MISCELLANEOUS) IMPLANT
SUT ENDO VLOC 180-0-8IN (SUTURE) ×2 IMPLANT
SUT MNCRL AB 4-0 PS2 18 (SUTURE) ×4 IMPLANT
SUT VICRYL 0 UR6 27IN ABS (SUTURE) ×4 IMPLANT
SYR 10ML LL (SYRINGE) ×1 IMPLANT
SYR 50ML LL SCALE MARK (SYRINGE) ×2 IMPLANT
SYSTEM CARTER THOMASON II (TROCAR) IMPLANT
TIP RUMI ORANGE 6.7MMX12CM (TIP) IMPLANT
TIP UTERINE 5.1X6CM LAV DISP (MISCELLANEOUS) IMPLANT
TIP UTERINE 6.7X10CM GRN DISP (MISCELLANEOUS) IMPLANT
TIP UTERINE 6.7X6CM WHT DISP (MISCELLANEOUS) IMPLANT
TIP UTERINE 6.7X8CM BLUE DISP (MISCELLANEOUS) IMPLANT
TOWEL OR 17X26 10 PK STRL BLUE (TOWEL DISPOSABLE) ×2 IMPLANT
TRAY FOLEY W/BAG SLVR 14FR LF (SET/KITS/TRAYS/PACK) ×2 IMPLANT
TRENDGUARD 450 HYBRID PRO PACK (MISCELLANEOUS) ×2
TROCAR BALLN 12MMX100 BLUNT (TROCAR) IMPLANT
TROCAR PORT AIRSEAL 5X120 (TROCAR) IMPLANT
TROCAR XCEL NON-BLD 11X100MML (ENDOMECHANICALS) IMPLANT
TROCAR XCEL NON-BLD 5MMX100MML (ENDOMECHANICALS) ×1 IMPLANT
UNDERPAD 30X36 HEAVY ABSORB (UNDERPADS AND DIAPERS) ×2 IMPLANT

## 2019-09-20 NOTE — H&P (Signed)
   COUTURE PFAHL  Oct 19, 1955 [id number]  HPI The patient is a 64 y.o. G2P1011 who presents today for scheduled TLH BSO for complex atypical hyperplasia.  No changes to her medical history since her pre op exam, denies CP, SOB, fever/chills, dysuria.  Physical Exam  BP 125/84   Pulse 62   Temp 98 F (36.7 C) (Oral)   Resp 18   Ht 5\' 5"  (1.651 m)   Wt 86.5 kg   SpO2 98%   BMI 31.72 kg/m   General: Pleasant female, no acute distress, alert and oriented CV: RRR, no murmurs Pulm: good respiratory effort, CTAB   Plan Proceed with TLH BSO cystoscopy as planned.  All questions answered and the patient agrees to proceed.   Joseph Pierini, MD 09/20/19

## 2019-09-20 NOTE — Anesthesia Preprocedure Evaluation (Addendum)
Anesthesia Evaluation  Patient identified by MRN, date of birth, ID band Patient awake    Reviewed: Allergy & Precautions, NPO status , Patient's Chart, lab work & pertinent test results, reviewed documented beta blocker date and time   Airway Mallampati: II  TM Distance: >3 FB Neck ROM: Full    Dental  (+) Edentulous Upper, Edentulous Lower   Pulmonary neg pulmonary ROS, Current Smoker and Patient abstained from smoking.,    Pulmonary exam normal breath sounds clear to auscultation       Cardiovascular hypertension, Pt. on medications and Pt. on home beta blockers negative cardio ROS Normal cardiovascular exam Rhythm:Regular Rate:Normal     Neuro/Psych negative neurological ROS  negative psych ROS   GI/Hepatic negative GI ROS, Neg liver ROS,   Endo/Other  Hypothyroidism   Renal/GU negative Renal ROS  negative genitourinary   Musculoskeletal negative musculoskeletal ROS (+)   Abdominal   Peds  Hematology negative hematology ROS (+)   Anesthesia Other Findings   Reproductive/Obstetrics                            Anesthesia Physical Anesthesia Plan  ASA: II  Anesthesia Plan: General   Post-op Pain Management:    Induction: Intravenous  PONV Risk Score and Plan: 2 and Midazolam, Dexamethasone and Ondansetron  Airway Management Planned: Oral ETT  Additional Equipment:   Intra-op Plan:   Post-operative Plan: Extubation in OR  Informed Consent: I have reviewed the patients History and Physical, chart, labs and discussed the procedure including the risks, benefits and alternatives for the proposed anesthesia with the patient or authorized representative who has indicated his/her understanding and acceptance.     Dental advisory given  Plan Discussed with: CRNA  Anesthesia Plan Comments:         Anesthesia Quick Evaluation

## 2019-09-20 NOTE — Op Note (Signed)
Name: KAYLEANN SAPIA  Age: 64 y.o.  Date of Birth: [birth year]  Medical Record #: [id number]  Operative Note  Preoperative Diagnosis: Complex endometrial hyperplasia with atypia Procedure: Total laparoscopic hysterectomy, bilateral salpingo-oophorectomy, cystoscopy Postoperative Diagnosis: same Surgeon: Joseph Pierini, MD Assistant: Princess Bruins, MD Estimated Blood Loss: 10 mL Anesthesia: General, local with 0.25% bupivacaine with epinephrine Findings: Sigmoid epiploica adhesion to left ovary, 8 week sized uterus with 2 cm subserosal left fundal fibroid.  Cervix with endocervical polyp present at the external cervical os.  Bilateral fallopian tube fragments with evidence of prior tubal ligation. Normal appearing bilateral ovaries.  Cystoscopy reveals Pyridium stained bilateral ureteral jets after completion of the surgery. Specimen: Uterus, cervix, bilateral fallopian tube (segments) and ovaries Complications: None. Date: 09/20/19  Under general anesthesia, Destiny Larsen was placed in the dorsolithotomy position with legs in Yellofin stirrups.  SCDs were applied to the lower extremities and were functioning.  She underwent bimanual exam and was prepped and draped in the usual sterile fashion. A surgical team time out was performed to verify and agree on procedure and patient consent. Two grams of IV cefazolin were administered prior to the beginning of the procedure.  An open sided speculum was placed in the vagina, and a single toothed tenaculum was used to grasp the anterior cervix. A uterine sound was placed to gauge the depth of uterine cavity of 8 cm. A medium V-care device was inserted into the cervix, and the intrauterine balloon was inflated with 10 mL of air. The cervical cup was placed and carefully checked to ensure that no vaginal tissue encroached the edges of the cup. Once placed, the vaginal cup was inserted and snugged up against the cervical cup and was locked in  place. The speculum and tenaculum were removed.  Local anesthesia was injected in the infraumbilical area.  A 5 mm infraumbilical incision was made and a 5 mm Visiport trocar was inserted with the laparoscope in place utilizing the direct entry method until the trocar was visualized to be in the abdominal cavity.  Pneumoperitoneum was established to 15 mmHg.  Intraperitoneal position was confirmed.  Incisions were made in the bilateral lower quadrants approximately 3 cm medial and superior to the anterior superior iliac spines.  An 11 mm right lateral and 8 mm Airseal trocar in the left lateral position were placed under laparoscopic guidance after injection with local anesthesia and transillumination of the abdominal wall performed to avoid vessel injury.   Inspection of the pelvic cavity was performed.  No injury to the bowel or omentum was noted from trocar entry.  The course of bilateral pelvic ureters was identified.  The Ligasure bipolar device was used to free filmy adhesion of the sigmoid epiloica to the left ovary and fallopian tube.  The left infundibular pelvic ligament was double fulgurated and transected.  The remaining segments of left fallopian tube were freed from the adnexa using the Ligasure.  The round ligament was fulgurated and transected with the Ligasure.  This was repeated bilaterally.   Additional sections of the cardinal ligaments were transected in a similar fashion bilaterally staying close to the uterus and away from the pelvic sidewall.  The assistant utilized the uterine manipulator to provide cephalad and traction to move the uterus contralateral to the side of dissection.  The bladder flap was developed.  Careful blunt and sharp dissection of the vesicouterine peritoneum allowed the bladder to be dissected free from the lower uterus without concern for perforation.  Incisions  into the anterior and posterior serosal surfaces allowed entry into the paracervical tissue space.    Uterine artery, uterine vein, and anatomosing vessels were ligated bilaterally with the Ligasure.   Attention was then paid to the colpotomy incision. The edge of the V-care cup was identified by applying gentle cephalad force through the tissue planes on the V-care device. The monopolar L-hook used to incise and cut within a shallow groove of the presenting face of the V-care cup.  The uterus was removed vaginally without difficulty.  The uterus and cervix were sent as a specimen to pathology with the right fallopian tube segment and bilateral ovaries attached; the detached left fallopian tube segment was also added to the specimen.  Pneumoperitoneum was established again as a pneumo-occlusion device was placed vaginally.  The vaginal cuff was closed with a running non-locking V-Lock suture using the [employer], avoiding bladder, and taking care to incorporate vaginal epithelium and posterior peritoneum as much as possible.  The pelvis was suctioned with the suction-irrigator.  Pneumoperitoneum was evacuated as the pedicles and vaginal cuff were evaluated and noted to be hemostatic.  Trocars were removed with a blunt probe placed through the trocar port to avoid entraping bowel in the trocar incisions.  The RLQ incision was inspected to find the fascia, which was grasped with a Kocher and closed with a running stitch with #0-Vicryl suture.  The subcutaneous tissue of the RLQ incision was irrigated and dried.  The laparoscopic port incisions were closed with 3-0 Monocryl suture with a subcuticular stitch each, and covered with Dermabond.    A cystoscopy was performed with a 30 degree cystoscope.  The Foley catheter was removed and the urethra and kept sterile.  The cystoscope was gently inserted into the urethra and the bladder was instilled with 300 mL of normal saline.  The bilateral ureteral orifices were identified as was the bladder dome.  There was vigorous ejection of Pyridium stained urine from  the bilateral ureteral orifices.  There was no evidence of stitches within the bladder mucosa nor injury or perforation.  The bladder was drained of contents and the Foley catheter was reinserted.  Instrument, sponge, and needle counts were correct at closure of peritoneum and also at the conclusion of the procedure. The patient tolerated the procedure well and she was taken to the recovery room in stable condition.   The surgical assist was present for the full case and was needed to assist with retraction, visualization, and wound closure.   Joseph Pierini, MD 09/20/19

## 2019-09-20 NOTE — Anesthesia Procedure Notes (Signed)
Procedure Name: Intubation Date/Time: 09/20/2019 7:31 AM Performed by: Justice Rocher, CRNA Pre-anesthesia Checklist: Patient identified, Emergency Drugs available, Suction available and Patient being monitored Patient Re-evaluated:Patient Re-evaluated prior to induction Oxygen Delivery Method: Circle system utilized Preoxygenation: Pre-oxygenation with 100% oxygen Induction Type: IV induction Ventilation: Mask ventilation without difficulty Laryngoscope Size: Mac and 3 Grade View: Grade II Tube type: Oral Tube size: 7.5 mm Number of attempts: 1 Airway Equipment and Method: Stylet and Oral airway Placement Confirmation: ETT inserted through vocal cords under direct vision,  positive ETCO2,  breath sounds checked- equal and bilateral and CO2 detector Secured at: 22 cm Tube secured with: Tape Dental Injury: Teeth and Oropharynx as per pre-operative assessment

## 2019-09-20 NOTE — Anesthesia Postprocedure Evaluation (Signed)
Anesthesia Post Note  Patient: Destiny Larsen  Procedure(s) Performed: HYSTERECTOMY TOTAL LAPAROSCOPIC WITH BILATERAL SALPINGO-OOPHORECTOMY, CYSTOSCOPY (Bilateral Abdomen)     Patient location during evaluation: PACU Anesthesia Type: General Level of consciousness: awake and alert Pain management: pain level controlled Vital Signs Assessment: post-procedure vital signs reviewed and stable Respiratory status: spontaneous breathing, nonlabored ventilation, respiratory function stable and patient connected to nasal cannula oxygen Cardiovascular status: blood pressure returned to baseline and stable Postop Assessment: no apparent nausea or vomiting Anesthetic complications: no    Last Vitals:  Vitals:   09/20/19 1152 09/20/19 1300  BP: (!) 144/84 (!) 155/76  Pulse: (!) 56 (!) 58  Resp: 16 16  Temp: 36.4 C 36.6 C  SpO2: 97% 100%    Last Pain:  Vitals:   09/20/19 1300  TempSrc:   PainSc: 0-No pain                 Chelsey L Woodrum

## 2019-09-20 NOTE — Transfer of Care (Signed)
Immediate Anesthesia Transfer of Care Note  Patient: Destiny Larsen  Procedure(s) Performed: Procedure(s) (LRB): HYSTERECTOMY TOTAL LAPAROSCOPIC WITH BILATERAL SALPINGO-OOPHORECTOMY, CYSTOSCOPY (Bilateral)  Patient Location: PACU  Anesthesia Type: General  Level of Consciousness: awake, sedated, patient cooperative and responds to stimulation  Airway & Oxygen Therapy: Patient Spontanous Breathing and Patient connected to [place] 02 and soft FM   Post-op Assessment: Report given to PACU RN, Post -op Vital signs reviewed and stable and Patient moving all extremities  Post vital signs: Reviewed and stable  Complications: No apparent anesthesia complications

## 2019-09-21 DIAGNOSIS — N8502 Endometrial intraepithelial neoplasia [EIN]: Secondary | ICD-10-CM | POA: Diagnosis not present

## 2019-09-21 MED ORDER — ACETAMINOPHEN 500 MG PO TABS
ORAL_TABLET | ORAL | Status: AC
  Filled 2019-09-21: qty 2

## 2019-09-21 MED ORDER — KETOROLAC TROMETHAMINE 30 MG/ML IJ SOLN
INTRAMUSCULAR | Status: AC
  Filled 2019-09-21: qty 1

## 2019-09-21 NOTE — Discharge Instructions (Signed)
Laparoscopically Assisted Vaginal Hysterectomy, Care After This sheet gives you information about how to care for yourself after your procedure. Your health care provider may also give you more specific instructions. If you have problems or questions, contact your health care provider. What can I expect after the procedure? After the procedure, it is common to have:  Soreness and numbness in your incision areas.  Abdominal pain. You will be given pain medicine to control it.  Vaginal bleeding and discharge. You will need to use a sanitary napkin after this procedure.  Sore throat from the breathing tube that was inserted during surgery. Follow these instructions at home: Medicines  Take over-the-counter and prescription medicines only as told by your health care provider.  Do not take aspirin or ibuprofen. These medicines can cause bleeding.  Do not drive or use heavy machinery while taking prescription pain medicine.  Do not drive for 24 hours if you were given a medicine to help you relax (sedative) during the procedure. Incision care   Follow instructions from your health care provider about how to take care of your incisions. Make sure you: ? Wash your hands with soap and water before you change your bandage (dressing). If soap and water are not available, use hand sanitizer. ? Change your dressing as told by your health care provider. ? Leave stitches (sutures), skin glue, or adhesive strips in place. These skin closures may need to stay in place for 2 weeks or longer. If adhesive strip edges start to loosen and curl up, you may trim the loose edges. Do not remove adhesive strips completely unless your health care provider tells you to do that.  Check your incision area every day for signs of infection. Check for: ? Redness, swelling, or pain. ? Fluid or blood. ? Warmth. ? Pus or a bad smell. Activity  Get regular exercise as told by your health care provider. You may be  told to take short walks every day and go farther each time.  Return to your normal activities as told by your health care provider. Ask your health care provider what activities are safe for you.  Do not douche, use tampons, or have sexual intercourse for at least 6 weeks, or until your health care provider gives you permission.  Do not lift anything that is heavier than 10 lb (4.5 kg), or the limit that your health care provider tells you, until he or she says that it is safe. General instructions  Do not take baths, swim, or use a hot tub until your health care provider approves. Take showers instead of baths.  Do not drive for 24 hours if you received a sedative.  Do not drive or operate heavy machinery while taking prescription pain medicine.  To prevent or treat constipation while you are taking prescription pain medicine, your health care provider may recommend that you: ? Drink enough fluid to keep your urine clear or pale yellow. ? Take over-the-counter or prescription medicines. ? Eat foods that are high in fiber, such as fresh fruits and vegetables, whole grains, and beans. ? Limit foods that are high in fat and processed sugars, such as fried and sweet foods.  Keep all follow-up visits as told by your health care provider. This is important. Contact a health care provider if:  You have signs of infection, such as: ? Redness, swelling, or pain around your incision sites. ? Fluid or blood coming from an incision. ? An incision that feels warm to the   touch. ? Pus or a bad smell coming from an incision.  Your incision breaks open.  Your pain medicine is not helping.  You feel dizzy or light-headed.  You have pain or bleeding when you urinate.  You have persistent nausea and vomiting.  You have blood, pus, or a bad-smelling discharge from your vagina. Get help right away if:  You have a fever.  You have severe abdominal pain.  You have chest pain.  You have  shortness of breath.  You faint.  You have pain, swelling, or redness in your leg.  You have heavy bleeding from your vagina. Summary  After the procedure, it is common to have abdominal pain and vaginal bleeding.  You should not drive or lift heavy objects until your health care provider says that it is safe.  Contact your health care provider if you have any symptoms of infection, excessive vaginal bleeding, nausea, vomiting, or shortness of breath. This information is not intended to replace advice given to you by your health care provider. Make sure you discuss any questions you have with your health care provider. Document Revised: 06/19/2017 Document Reviewed: 09/02/2016 Elsevier Patient Education  2020 [employer]  

## 2019-09-22 LAB — SURGICAL PATHOLOGY

## 2019-09-22 NOTE — Discharge Summary (Signed)
Name: Destiny Larsen  Age: 64 y.o.  Date of Birth: December 26, 1955  Medical Record #: [id number]   Discharge Summary  DISCHARGE DIAGNOSIS: 1. Complex endometrial hyperplasia with atypia 2. S/p total laparoscopic hysterectomy, bilateral salpingo-oophorectomy, cystoscopy  ADMISSION DATE: 09/20/2019 DISMISSAL DATE: 09/21/2019  Destiny Larsen was admitted for the above surgery and had no intraoperative complications.  Her postoperative course was uneventful and she is tolerating a general diet without restriction on discharge.  She did initially have low urine output but this normalized with an additional IV fluid bolus of 1 liter LR.  Pain is currently minimal and controlled with oral medications and the patient is voiding and ambulating well prior to discharge.   Vital signs: BP 140/81 (BP Location: Right Arm)   Pulse 62   Temp 98.7 F (37.1 C)   Resp 16   Ht 5\' 5"  (1.651 m)   Wt 86.5 kg   SpO2 100%   BMI 31.72 kg/m   Patient is afebrile and normotensive at discharge. Abdomen is nondistended with positive bowel sounds.  Lungs are clear to auscultation bilaterally.  Incisions clean, dry and intact x3. No signs of erythema nor exudate. Labs are within expected limits at discharge.  Discharge medications include Ibuprofen 600 mg to be taken every six hours as needed for pain, oxycodone 5 mg to be taken every four to six hours as needed for moderate to severe pain, and appropriate use of stool softeners to avoid postoperative constipation. A common recommendation is to use Colace 100 mg by mouth two times a day as needed (hold Colace for loose stools). Potential adverse reactions to current medications were discussed.  Allergies as of 09/21/2019   No Known Allergies     Medication List    STOP taking these medications   aspirin 81 MG chewable tablet     TAKE these medications   acetaminophen 500 MG tablet Commonly known as: TYLENOL Take 2 tablets (1,000 mg total) by mouth every 6  (six) hours. Notes to patient: Next dose is due at 2 pm as needed for pain   atorvastatin 40 MG tablet Commonly known as: LIPITOR Take 40 mg by mouth daily.   cholecalciferol 25 MCG (1000 UNIT) tablet Commonly known as: VITAMIN D3 Take 1,000 Units by mouth daily.   docusate sodium 100 MG capsule Commonly known as: COLACE Take 1 capsule (100 mg total) by mouth 2 (two) times daily.   hydrochlorothiazide 12.5 MG tablet Commonly known as: HYDRODIURIL Take 12.5 mg by mouth daily.   ibuprofen 600 MG tablet Commonly known as: ADVIL Take 1 tablet (600 mg total) by mouth every 6 (six) hours. Notes to patient: Next dose is due at 930 am as needed for pain   levothyroxine 100 MCG tablet Commonly known as: SYNTHROID Take 100 mcg by mouth daily before breakfast.   lisinopril 40 MG tablet Commonly known as: ZESTRIL Take 40 mg by mouth daily.   metoprolol tartrate 25 MG tablet Commonly known as: LOPRESSOR Take 25 mg by mouth daily.   multivitamin with minerals Tabs tablet Take 1 tablet by mouth daily.   oxyCODONE 5 MG immediate release tablet Commonly known as: Oxy IR/ROXICODONE Take 1 tablet (5 mg total) by mouth every 4 (four) hours as needed for severe pain or breakthrough pain.       Patients may often be constipated after surgery due to prolonged periods of bedrest and use of oral narcotic analgesics. If there is no bowel movement for 3 days after  surgery, or if the patient is uncomfortable and unable to pass stool, she will try one or all of the following measures: 1.  Milk of Magnesia, 30 cc by mouth every 12 hours  2.  Dulcolax suppository, one suppository per rectum every 6 hours 3.  Metamucil, Fibercon or other bulk former, used as directed 4.  Fleets Enema 5.  Prunes or Prune Juice 6.  Miralax The patient is instructed to contact the clinic if these measures are unsuccessful or accompanied by severe abdominal pain, nausea and emesis, or fever.  Emergency Medical  Treatment and Active Labor Act (EMTALA):  Patient has no emergency condition and is stable for discharge.  Condition at discharge is independent but with activity restrictions of no lifting >20 lbs., no driving while on narcotic pain medications, and nothing per vagina for six to eight weeks.  Keep any incisions clean and dry by running soapy water over them and dabbing them dry. It is best to shower for the first week while the healing process is underway; after one week it is safe to take a bath.   Patient is to refrain from placing anything in the vagina within the next six weeks.  This pelvic rest includes intercourse, douching, and tampons.  The patient will call the [facility] for problems such as fever with chills, nausea, vomiting, constipation, heavy odorous vaginal discharge, burning, or pain associated with urination.  Also call for excessive vaginal bleeding, saturating more than one pad an hour for more than three consecutive hours.  Follow-up in the clinic in 2 weeks for an incision check and in 6 weeks for a complete postoperative visit.  She is advised to return to clinic earlier if signs of fever, increasing pain, or other postoperative concerns arise.    Joseph Pierini, M.D. [facility]

## 2019-09-23 NOTE — Progress Notes (Signed)
Please let Destiny Larsen know that the uterine pathology was as expected, the same complex atypical hyperplasia that was diagnosed at her preop biopsy.  No endometrial cancer.  Just findings of small fibroids and benign cervical polyp.  Her tubes and ovaries were normal.

## 2019-10-03 ENCOUNTER — Other Ambulatory Visit: Payer: Self-pay

## 2019-10-04 ENCOUNTER — Encounter: Payer: Self-pay | Admitting: Obstetrics and Gynecology

## 2019-10-04 ENCOUNTER — Ambulatory Visit (INDEPENDENT_AMBULATORY_CARE_PROVIDER_SITE_OTHER): Payer: BC Managed Care – PPO | Admitting: Obstetrics and Gynecology

## 2019-10-04 VITALS — BP 122/78

## 2019-10-04 DIAGNOSIS — N8502 Endometrial intraepithelial neoplasia [EIN]: Secondary | ICD-10-CM

## 2019-10-04 DIAGNOSIS — Z4889 Encounter for other specified surgical aftercare: Secondary | ICD-10-CM

## 2019-10-04 DIAGNOSIS — Z90710 Acquired absence of both cervix and uterus: Secondary | ICD-10-CM

## 2019-10-04 NOTE — Patient Instructions (Addendum)
Continue to follow lifting restrictions, limit to 20 pounds, continue to gradually ease back into activities carefully and avoid any strenuous activity or twisting/bending at the waist for the next month.

## 2019-10-04 NOTE — Progress Notes (Signed)
   Destiny Larsen  Nov 27, 1955 [id number]  SUBJECTIVE   REASON FOR APPOINTMENT Postoperative appointment, 2-week follow-up after hysterectomy  HISTORY OF PRESENT ILLNESS Destiny Larsen presents for routine 2 week post-operative follow up after a total laparoscopic hysterectomy with bilateral salpingo-oophorectomy and cystoscopy performed on 09/21/2019 for atypical complex hyperplasia.  The patient is doing well with no concerns.  She denies vaginal bleeding or discharge.  She is tolerating a normal diet.  Bowel and bladder function are normal.  Pain is very minimal and she only ever required OTC pain medications, did not use any narcotic pain relievers.  She indicates she is happy and doing very well, better than she expected.  OBJECTIVE  BP 122/78    PHYSICAL EXAM General:  Patient in no acute distress.  Abdomen: Soft, non tender, non-distended.  Incisions at bilateral lower quadrants and umbilicus are clean, dry, intact, well healing, no erythema nor drainage.  Remaining skin glue is noted around the edges of the incisions.  ASSESSMENT / PLAN  Routine 2 week post operative check.   The patient is doing quite well and meeting all postoperative milestones.  I reviewed the pathology report describing the normal bilateral fallopian tubes and ovaries, and complex hyperplasia with atypia was noted to involve parts of a 0.7 cm polyp within the uterus.  She also had some small benign leiomyomata.  I showed her some pictures taken during the laparoscopy.  I encouraged her to continue to follow activity and lifting restrictions for at least next 4 weeks, pelvic rest for up to 8 weeks after surgery.  She will return in 4 weeks for her 6-week final complete postoperative exam.   Joseph Pierini MD 10/04/19

## 2019-10-31 ENCOUNTER — Other Ambulatory Visit: Payer: Self-pay

## 2019-11-01 ENCOUNTER — Encounter: Payer: Self-pay | Admitting: Obstetrics and Gynecology

## 2019-11-01 ENCOUNTER — Ambulatory Visit (INDEPENDENT_AMBULATORY_CARE_PROVIDER_SITE_OTHER): Payer: BC Managed Care – PPO | Admitting: Obstetrics and Gynecology

## 2019-11-01 VITALS — BP 124/76

## 2019-11-01 DIAGNOSIS — Z9889 Other specified postprocedural states: Secondary | ICD-10-CM

## 2019-11-01 DIAGNOSIS — R102 Pelvic and perineal pain: Secondary | ICD-10-CM

## 2019-11-01 DIAGNOSIS — Z90710 Acquired absence of both cervix and uterus: Secondary | ICD-10-CM

## 2019-11-01 LAB — WET PREP FOR TRICH, YEAST, CLUE

## 2019-11-01 NOTE — Progress Notes (Signed)
   Destiny Larsen  [birth year] [id number]   SUBJECTIVE   REASON FOR APPOINTMENT Postoperative appointment, 6-week follow-up after hysterectomy  HISTORY OF PRESENT ILLNESS Destiny Larsen presents for routine 6 week post-operative follow up after a total laparoscopic hysterectomy with bilateral salpingo-oophorectomy and cystoscopy performed on 09/21/2019 for atypical complex hyperplasia.  The patient is doing well.  She denies vaginal bleeding or discharge.  Feels an occasional twinge at the external vaginal area.  No real itching or burning.  Will feel the sensation briefly many days of the week.  Not overly bothersome.  Having onset of mild hot flashes and night sweats since surgery but manageable with conservative measures. She is tolerating a normal diet.  Bowel and bladder function are normal.    No pain at this time.  OBJECTIVE  BP 122/78    PHYSICAL EXAM General:  Patient in no acute distress.  Abdomen: Soft, non tender, non-distended.  Incisions at bilateral lower quadrants and umbilicus are clean, dry, intact, well healing, no erythema nor drainage.   Pelvic: External genitalia within normal limits.  Atrophic changes as expected.  Vaginal cavity appears normal with clearish white discharge present.  Vaginal cuff without visible sutures.  Intact and well-healing.  Cuff is nontender to palpation. Genital wet prep indicates positive hyphae, no Trichomonas, no clue cells.  Moderate WBC and bacteria.  7-12 epithelial cells/hpf.   ASSESSMENT / PLAN  Routine 6 week post operative check.   The patient is doing quite well and meeting all postoperative milestones.  I had previously reviewed the pathology report.  She may return to work this coming Friday at the 6-week mark but I would encourage her to following a lifting restriction less than 10 pounds for 2 more weeks and then she can slowly increase activity from there.  Return to work note is provided highlighting these  restrictions.  May resume vaginal intercourse as desired in 2 more weeks. She was found to have yeast on the vaginal wet prep today, I would suggest using a vaginal miconazole cream over-the-counter for 1 week to help clear this up as this may be the cause for her symptoms.  She should let us know if the hot flashes are getting unmanageable.      Joseph Pierini MD, FACOG 11/01/19

## 2020-07-31 ENCOUNTER — Other Ambulatory Visit: Payer: Self-pay | Admitting: Internal Medicine

## 2020-07-31 DIAGNOSIS — Z1231 Encounter for screening mammogram for malignant neoplasm of breast: Secondary | ICD-10-CM

## 2020-09-11 ENCOUNTER — Other Ambulatory Visit: Payer: Self-pay

## 2020-09-11 ENCOUNTER — Ambulatory Visit
Admission: RE | Admit: 2020-09-11 | Discharge: 2020-09-11 | Disposition: A | Discharge status: Home / Self Care | Payer: BC Managed Care – PPO | Source: Ambulatory Visit | Attending: Internal Medicine | Admitting: Internal Medicine

## 2020-09-11 DIAGNOSIS — Z1231 Encounter for screening mammogram for malignant neoplasm of breast: Secondary | ICD-10-CM

## 2021-09-09 ENCOUNTER — Other Ambulatory Visit: Payer: Self-pay | Admitting: Internal Medicine

## 2021-09-09 DIAGNOSIS — Z1231 Encounter for screening mammogram for malignant neoplasm of breast: Secondary | ICD-10-CM

## 2021-09-13 ENCOUNTER — Ambulatory Visit
Admission: RE | Admit: 2021-09-13 | Discharge: 2021-09-13 | Disposition: A | Discharge status: Home / Self Care | Payer: Medicare Other | Source: Ambulatory Visit | Attending: Internal Medicine | Admitting: Internal Medicine

## 2021-09-13 ENCOUNTER — Other Ambulatory Visit: Payer: Self-pay

## 2021-09-13 DIAGNOSIS — Z1231 Encounter for screening mammogram for malignant neoplasm of breast: Secondary | ICD-10-CM

## 2021-12-05 IMAGING — MG DIGITAL DIAGNOSTIC BILAT W/ TOMO W/ CAD
8 of 11 series · 8 of 27 positions shown · non-contrast
Comparison: Previous exam(s).

CLINICAL DATA: Short-term follow-up for left breast calcifications.
calcifications in the left breast, which revealed benign pathology.

EXAM:
DIGITAL DIAGNOSTIC BILATERAL MAMMOGRAM WITH CAD AND TOMO

[L ML (1 of 2)]
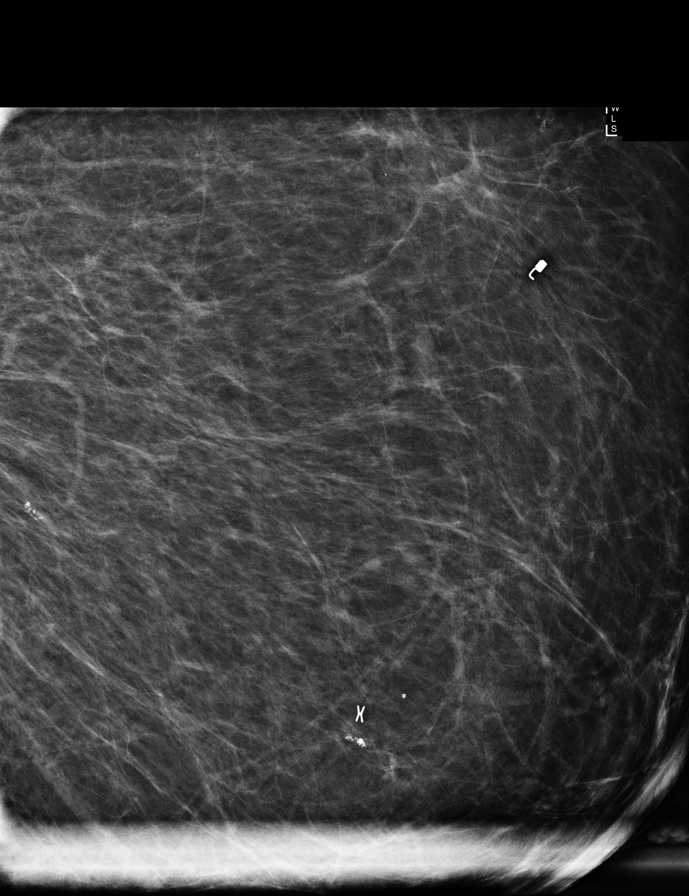

[L ML (2 of 2)]
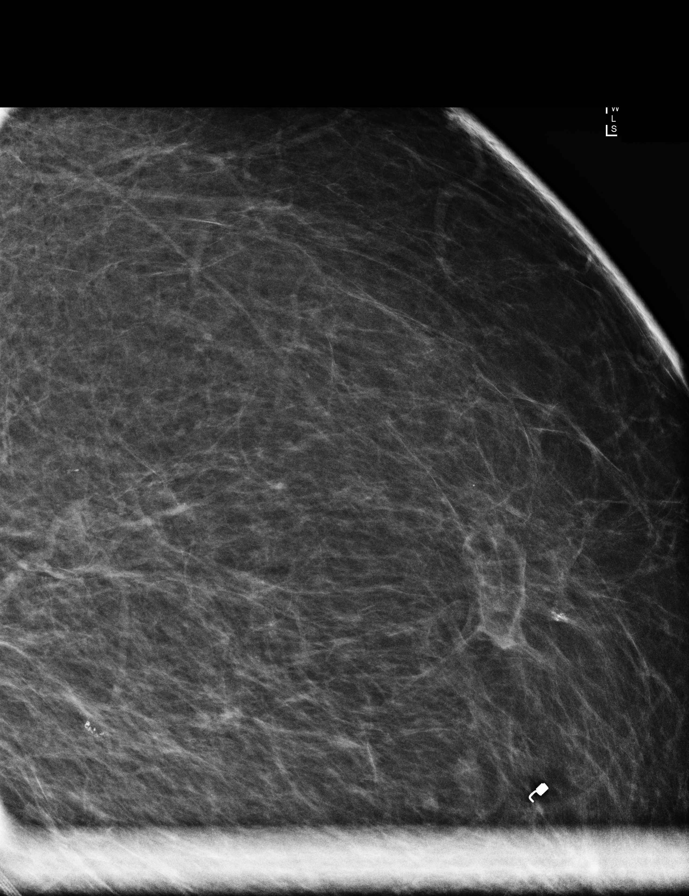

[L CC]
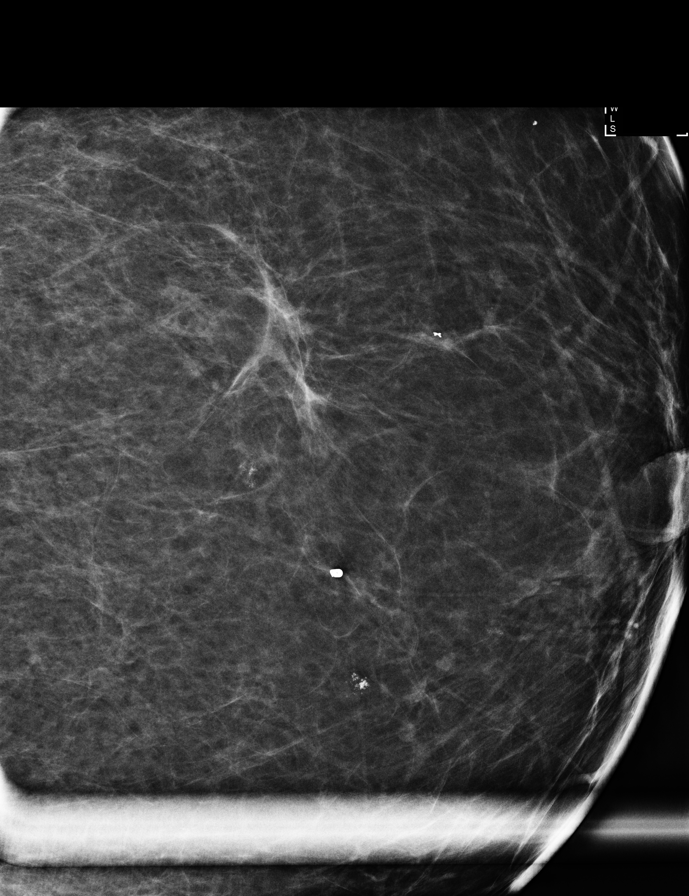

[R MLO synth-2D]
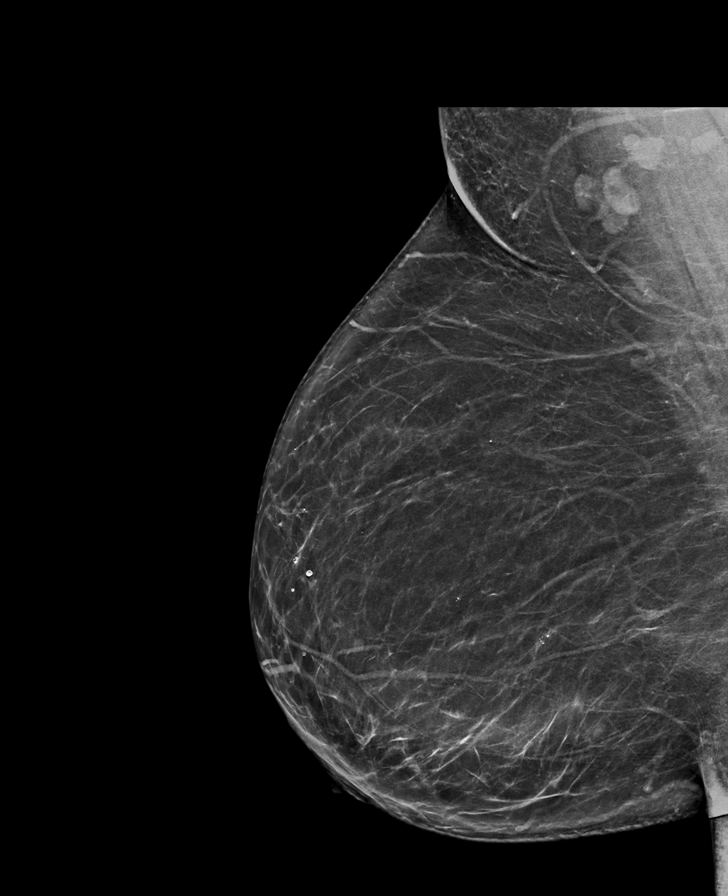

[L CC synth-2D]
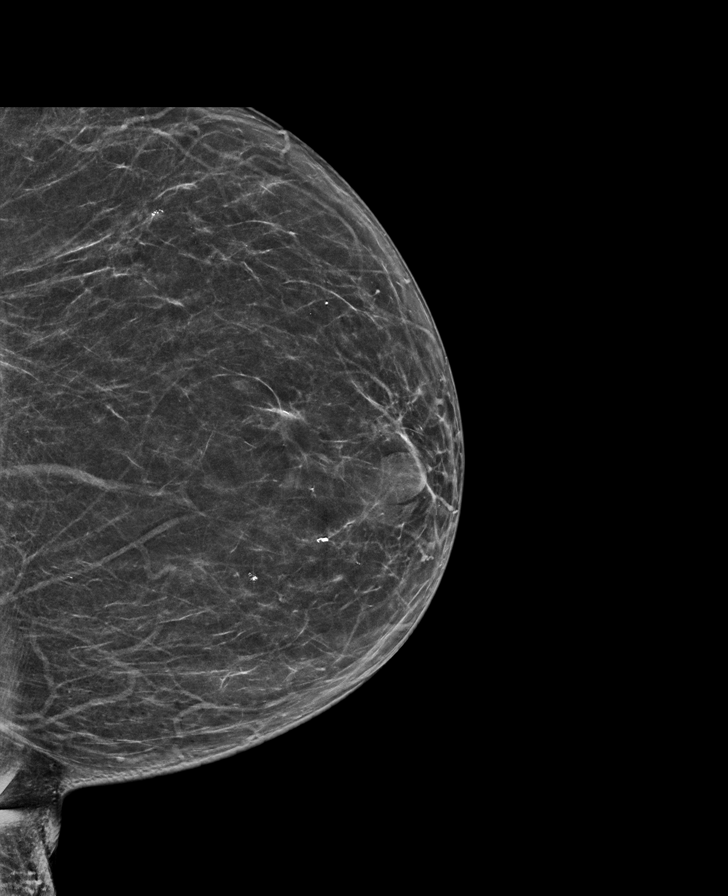

[R CC synth-2D]
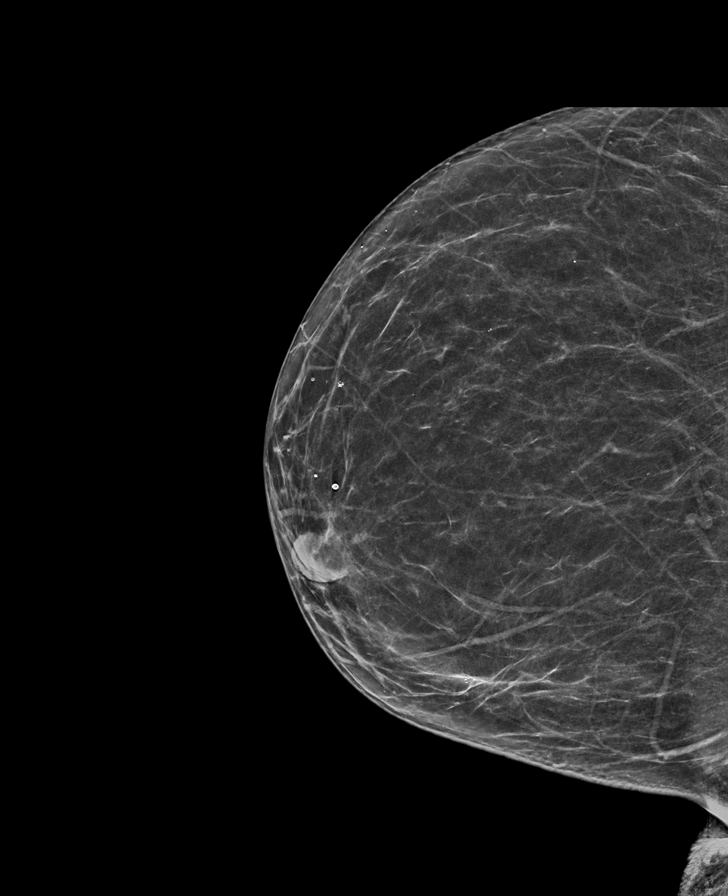

[L MLO synth-2D]
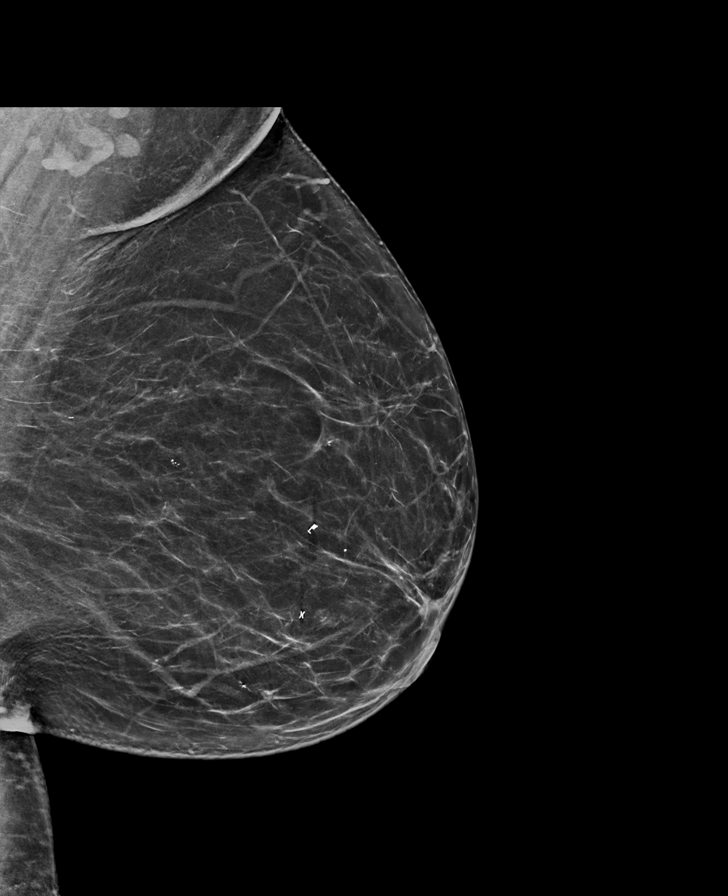

[L MLO tomo · tomo slice 41/80.0]
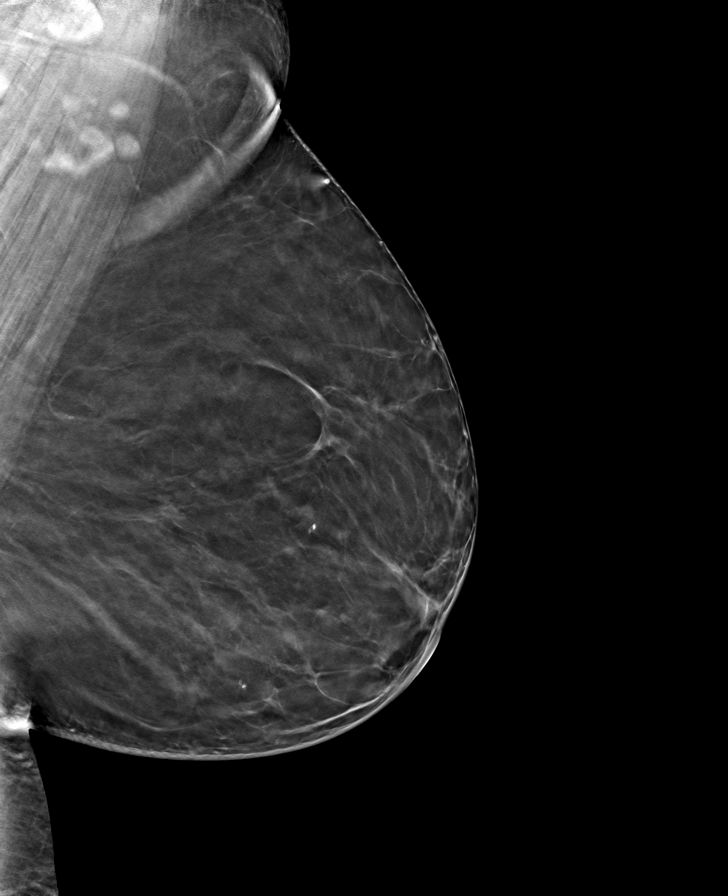

[8 of 27 positions shown; findings below may reference images not displayed]

ACR Breast Density Category b: There are scattered areas of
fibroglandular density.
FINDINGS: 3 mm group of calcifications in the left breast are stable, and have
been stable for at least a year. There are no new calcifications in
either breast. There are no areas of nonsurgical architectural
distortion. There are no masses.

Mammographic images were processed with CAD.
IMPRESSION: 1. No evidence of breast malignancy.
2. Benign breast calcifications, with small, stable, similar
appearing groups bilaterally.

RECOMMENDATION:
Screening mammogram in one year.(Code:IL-[id number])

I have discussed the findings and recommendations with the patient.
If applicable, a reminder letter will be sent to the patient
regarding the next appointment.

BI-RADS CATEGORY  2: Benign.

## 2022-09-26 ENCOUNTER — Other Ambulatory Visit: Payer: Self-pay | Admitting: Internal Medicine

## 2022-09-26 DIAGNOSIS — Z0000 Encounter for general adult medical examination without abnormal findings: Secondary | ICD-10-CM

## 2022-09-30 ENCOUNTER — Ambulatory Visit
Admission: RE | Admit: 2022-09-30 | Discharge: 2022-09-30 | Disposition: A | Discharge status: Home / Self Care | Payer: Medicare Other | Source: Ambulatory Visit | Attending: Internal Medicine | Admitting: Internal Medicine

## 2022-09-30 DIAGNOSIS — Z0000 Encounter for general adult medical examination without abnormal findings: Secondary | ICD-10-CM

## 2023-09-21 ENCOUNTER — Other Ambulatory Visit: Payer: Self-pay | Admitting: Internal Medicine

## 2023-09-21 DIAGNOSIS — Z1231 Encounter for screening mammogram for malignant neoplasm of breast: Secondary | ICD-10-CM

## 2023-10-07 ENCOUNTER — Ambulatory Visit

## 2023-10-08 ENCOUNTER — Ambulatory Visit
Admission: RE | Admit: 2023-10-08 | Discharge: 2023-10-08 | Disposition: A | Discharge status: Home / Self Care | Source: Ambulatory Visit | Attending: Internal Medicine | Admitting: Internal Medicine

## 2023-10-08 DIAGNOSIS — Z1231 Encounter for screening mammogram for malignant neoplasm of breast: Secondary | ICD-10-CM
